# Patient Record
Sex: Female | Born: 1965 | Race: White | Hispanic: No | Marital: Married | Smoking: Former smoker
Health system: Southern US, Community
[De-identification: ages and names within clinical notes are randomized; demographics above are authoritative.]

## PROBLEM LIST (undated history)

## (undated) DIAGNOSIS — Z87442 Personal history of urinary calculi: Secondary | ICD-10-CM

## (undated) DIAGNOSIS — E7800 Pure hypercholesterolemia, unspecified: Secondary | ICD-10-CM

## (undated) DIAGNOSIS — R519 Headache, unspecified: Secondary | ICD-10-CM

## (undated) DIAGNOSIS — Z8489 Family history of other specified conditions: Secondary | ICD-10-CM

## (undated) DIAGNOSIS — R112 Nausea with vomiting, unspecified: Secondary | ICD-10-CM

## (undated) DIAGNOSIS — Z9889 Other specified postprocedural states: Secondary | ICD-10-CM

## (undated) DIAGNOSIS — K219 Gastro-esophageal reflux disease without esophagitis: Secondary | ICD-10-CM

## (undated) HISTORY — PX: ECTOPIC PREGNANCY SURGERY: SHX613

## (undated) HISTORY — PX: COLONOSCOPY: SHX174

## (undated) HISTORY — PX: ESOPHAGEAL DILATION: SHX303

## (undated) HISTORY — PX: RHINOPLASTY: SUR1284

## (undated) HISTORY — DX: Gastro-esophageal reflux disease without esophagitis: K21.9

---

## 1997-08-10 ENCOUNTER — Inpatient Hospital Stay (HOSPITAL_COMMUNITY): Admission: AD | Admit: 1997-08-10 | Discharge: 1997-08-10 | Payer: Self-pay | Admitting: Obstetrics and Gynecology

## 1997-08-11 ENCOUNTER — Inpatient Hospital Stay (HOSPITAL_COMMUNITY): Admission: AD | Admit: 1997-08-11 | Discharge: 1997-08-14 | Payer: Self-pay | Admitting: Obstetrics and Gynecology

## 1997-09-22 ENCOUNTER — Other Ambulatory Visit: Admission: RE | Admit: 1997-09-22 | Discharge: 1997-09-22 | Payer: Self-pay | Admitting: Obstetrics and Gynecology

## 1998-10-24 ENCOUNTER — Other Ambulatory Visit: Admission: RE | Admit: 1998-10-24 | Discharge: 1998-10-24 | Payer: Self-pay | Admitting: Obstetrics and Gynecology

## 1999-05-12 ENCOUNTER — Inpatient Hospital Stay (HOSPITAL_COMMUNITY): Admission: AD | Admit: 1999-05-12 | Discharge: 1999-05-14 | Payer: Self-pay | Admitting: *Deleted

## 1999-06-13 ENCOUNTER — Other Ambulatory Visit: Admission: RE | Admit: 1999-06-13 | Discharge: 1999-06-13 | Payer: Self-pay | Admitting: Obstetrics and Gynecology

## 2003-03-02 ENCOUNTER — Other Ambulatory Visit: Admission: RE | Admit: 2003-03-02 | Discharge: 2003-03-02 | Payer: Self-pay | Admitting: Obstetrics and Gynecology

## 2004-02-09 ENCOUNTER — Encounter: Admission: RE | Admit: 2004-02-09 | Discharge: 2004-02-09 | Payer: Self-pay | Admitting: Otolaryngology

## 2004-03-15 ENCOUNTER — Other Ambulatory Visit: Admission: RE | Admit: 2004-03-15 | Discharge: 2004-03-15 | Payer: Self-pay | Admitting: Obstetrics and Gynecology

## 2004-03-29 ENCOUNTER — Ambulatory Visit (HOSPITAL_COMMUNITY): Admission: RE | Admit: 2004-03-29 | Discharge: 2004-03-29 | Payer: Self-pay | Admitting: Obstetrics and Gynecology

## 2005-12-23 ENCOUNTER — Other Ambulatory Visit: Admission: RE | Admit: 2005-12-23 | Discharge: 2005-12-23 | Payer: Self-pay | Admitting: Obstetrics and Gynecology

## 2006-02-24 ENCOUNTER — Ambulatory Visit (HOSPITAL_COMMUNITY): Admission: RE | Admit: 2006-02-24 | Discharge: 2006-02-24 | Payer: Self-pay | Admitting: Obstetrics and Gynecology

## 2006-12-16 IMAGING — CT CT NECK W/ CM
2 series · 10 of 14 positions shown, 12 images · IV contrast (75CC OMNI 300)
Comparison: None.

CLINICAL DATA: Sore throat for 10-14 days despite antibiotic therapy.  Question peritonsillar abscess.
CT OF THE NECK WITH CONTRAST:
Multidetector helical CT imaging was performed during the IV bolus injection of 75 cc of Omnipaque 300.

[Series 2: neck · axial · 0.39mm/px · z∈[+11,+195]mm · 8 of 64 slices shown, 10 images]
[im 8/64  soft-tissue]
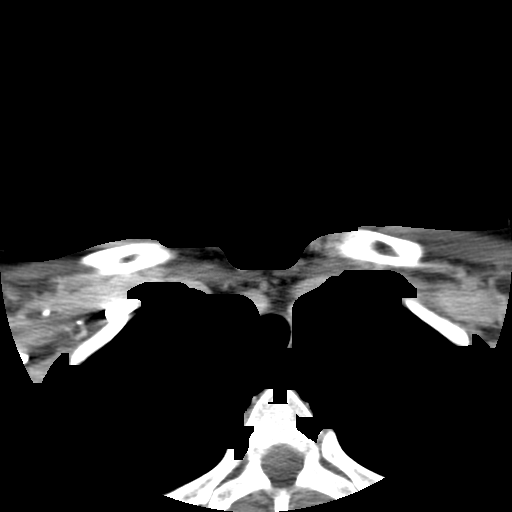
[im 8/64  bone]
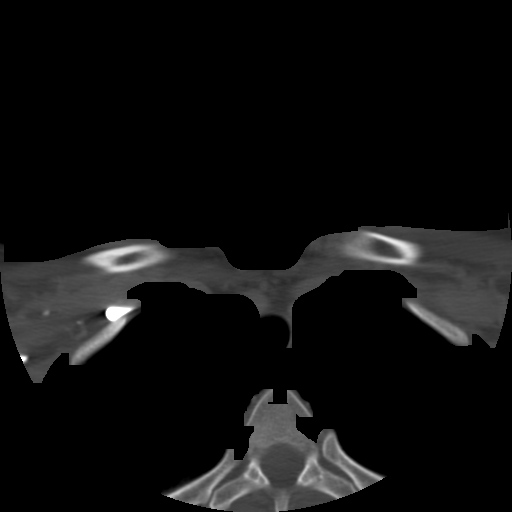
[im 15/64  bone]
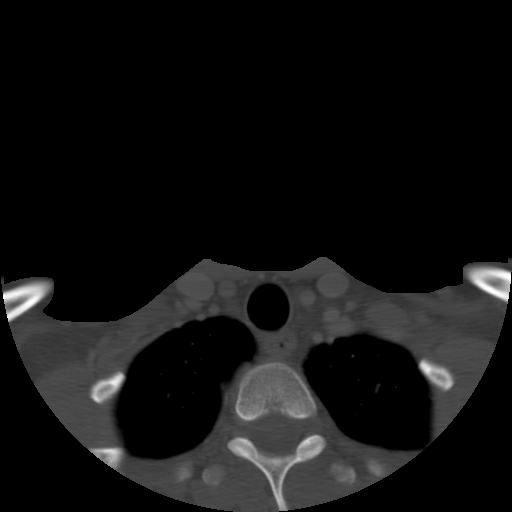
[im 22/64  bone]
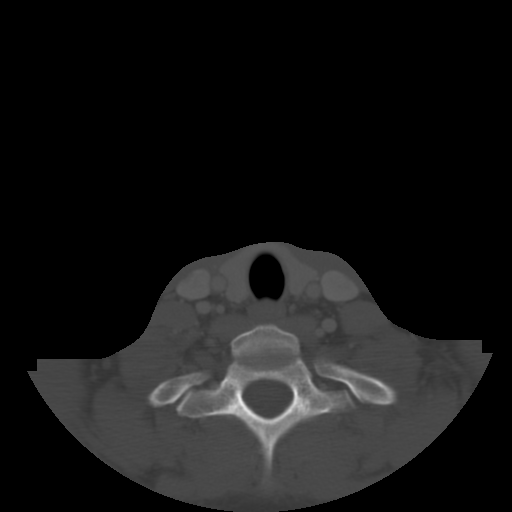
[im 29/64  bone]
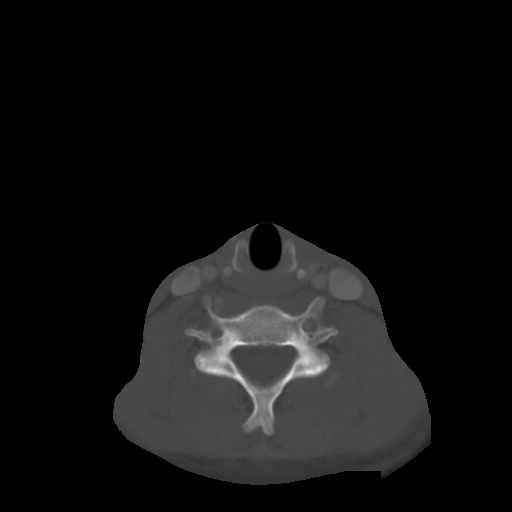
[im 36/64  soft-tissue]
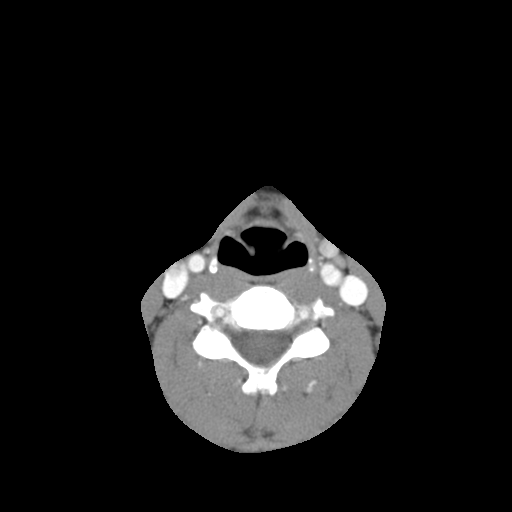
[im 36/64  bone]
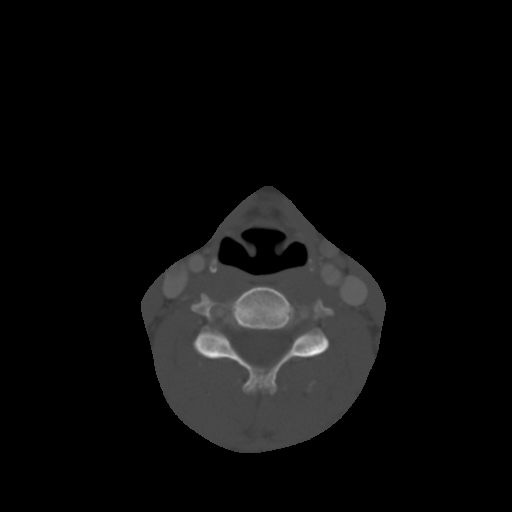
[im 43/64  bone]
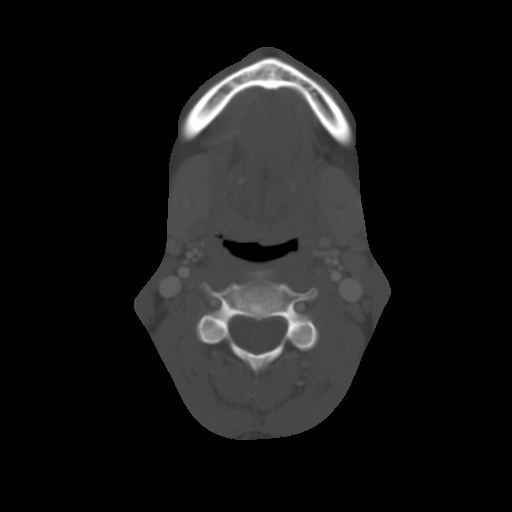
[im 50/64  bone]
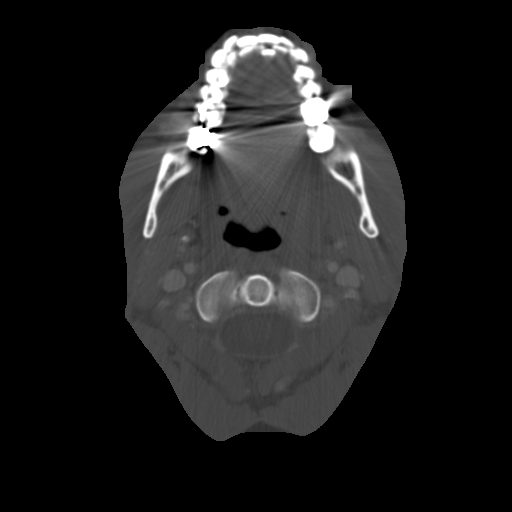
[im 57/64  bone]
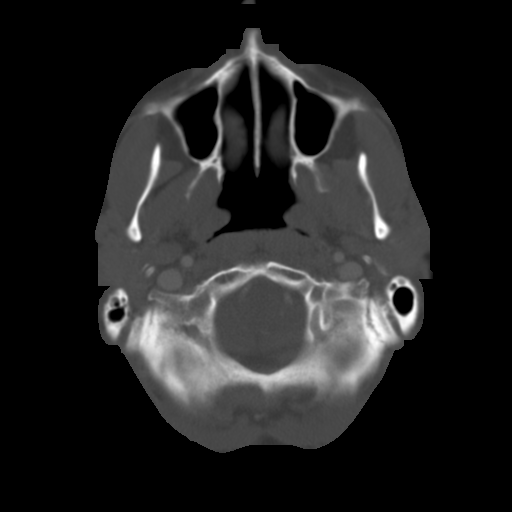

[Series 3: recon 2: neck · axial · 0.59mm/px · z∈[+15,+41]mm · 2 of 23 slices shown]
[im 8/23  bone]
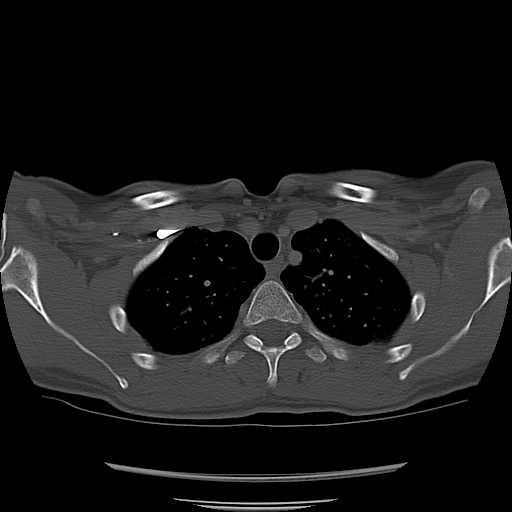
[im 15/23  bone]
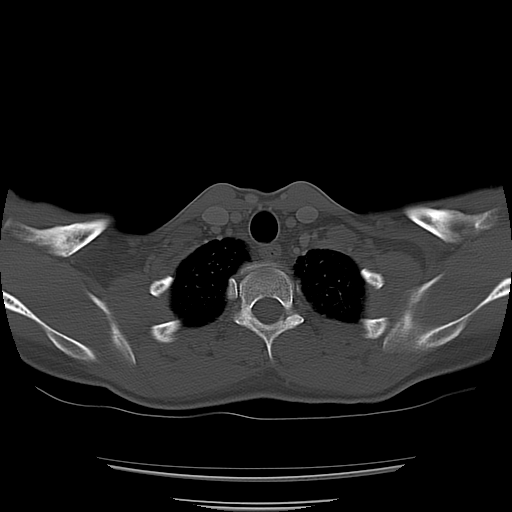

[10 of 14 positions shown; findings below may reference images not displayed]

FINDINGS: There is asymmetric enlargement of the left palatine tonsil compared with the right.  Central low attenuation is noted within the tonsil, measuring up to 6 mm in diameter on image 17.  Lesser, well defined low attenuation is seen more superiorly on images 14 and 16.  There is some beam hardening artifact related to the patient's dental Youvens.  The fat planes in the parapharyngeal space are preserved, and no retropharyngeal or prevertebral abnormalities are demonstrated.  Mild asymmetry of the nasopharynx with fullness on the left may be secondary to secretions.  Right tonsil appears unremarkable.  The visualized paranasal sinuses and intracranial structures are unremarkable.  No enlarged cervical lymph nodes are seen.
IMPRESSION: 1.  There is asymmetric enlargement of the left palatine tonsil with central low attenuation compatible with a small tonsillar abscess.  No significant peritonsillar inflammatory changes are identified.
2.  A preliminary report of these findings has been sent with the patient to Dr. Enrichetta?[REDACTED].

## 2007-08-12 ENCOUNTER — Emergency Department (HOSPITAL_BASED_OUTPATIENT_CLINIC_OR_DEPARTMENT_OTHER): Admission: EM | Admit: 2007-08-12 | Discharge: 2007-08-12 | Payer: Self-pay | Admitting: Emergency Medicine

## 2007-08-13 ENCOUNTER — Telehealth (INDEPENDENT_AMBULATORY_CARE_PROVIDER_SITE_OTHER): Payer: Self-pay

## 2007-08-14 ENCOUNTER — Emergency Department (HOSPITAL_BASED_OUTPATIENT_CLINIC_OR_DEPARTMENT_OTHER): Admission: EM | Admit: 2007-08-14 | Discharge: 2007-08-14 | Payer: Self-pay | Admitting: Emergency Medicine

## 2007-08-17 ENCOUNTER — Ambulatory Visit: Payer: Self-pay | Admitting: Internal Medicine

## 2007-08-17 DIAGNOSIS — R079 Chest pain, unspecified: Secondary | ICD-10-CM | POA: Insufficient documentation

## 2007-08-17 DIAGNOSIS — R1319 Other dysphagia: Secondary | ICD-10-CM

## 2007-08-20 ENCOUNTER — Ambulatory Visit (HOSPITAL_COMMUNITY): Admission: RE | Admit: 2007-08-20 | Discharge: 2007-08-20 | Payer: Self-pay | Admitting: Internal Medicine

## 2007-08-21 ENCOUNTER — Telehealth: Payer: Self-pay | Admitting: Internal Medicine

## 2007-08-25 ENCOUNTER — Encounter: Payer: Self-pay | Admitting: Internal Medicine

## 2007-08-25 ENCOUNTER — Ambulatory Visit: Payer: Self-pay | Admitting: Internal Medicine

## 2007-08-28 ENCOUNTER — Ambulatory Visit (HOSPITAL_COMMUNITY): Admission: RE | Admit: 2007-08-28 | Discharge: 2007-08-28 | Payer: Self-pay | Admitting: Internal Medicine

## 2007-09-21 ENCOUNTER — Ambulatory Visit: Payer: Self-pay | Admitting: Endocrinology

## 2007-09-21 DIAGNOSIS — E041 Nontoxic single thyroid nodule: Secondary | ICD-10-CM | POA: Insufficient documentation

## 2007-09-21 LAB — CONVERTED CEMR LAB: TSH: 0.86 u[IU]/mL

## 2007-09-23 ENCOUNTER — Encounter: Payer: Self-pay | Admitting: Endocrinology

## 2007-09-23 ENCOUNTER — Encounter: Admission: RE | Admit: 2007-09-23 | Discharge: 2007-09-23 | Payer: Self-pay | Admitting: Endocrinology

## 2007-09-23 ENCOUNTER — Other Ambulatory Visit: Admission: RE | Admit: 2007-09-23 | Discharge: 2007-09-23 | Payer: Self-pay | Admitting: Interventional Radiology

## 2007-09-23 ENCOUNTER — Encounter (INDEPENDENT_AMBULATORY_CARE_PROVIDER_SITE_OTHER): Payer: Self-pay | Admitting: Interventional Radiology

## 2008-11-02 ENCOUNTER — Ambulatory Visit (HOSPITAL_COMMUNITY): Admission: RE | Admit: 2008-11-02 | Discharge: 2008-11-02 | Payer: Self-pay | Admitting: Plastic Surgery

## 2009-07-13 ENCOUNTER — Ambulatory Visit: Payer: Self-pay | Admitting: Radiology

## 2009-07-13 ENCOUNTER — Emergency Department (HOSPITAL_BASED_OUTPATIENT_CLINIC_OR_DEPARTMENT_OTHER): Admission: EM | Admit: 2009-07-13 | Discharge: 2009-07-13 | Payer: Self-pay | Admitting: Emergency Medicine

## 2010-03-04 ENCOUNTER — Encounter: Payer: Self-pay | Admitting: Obstetrics and Gynecology

## 2010-04-30 LAB — BASIC METABOLIC PANEL
BUN: 15 mg/dL (ref 6–23)
CO2: 24 mEq/L (ref 19–32)
Calcium: 9.6 mg/dL (ref 8.4–10.5)
Chloride: 110 mEq/L (ref 96–112)
Creatinine, Ser: 0.8 mg/dL (ref 0.4–1.2)
GFR calc Af Amer: >60 mL/min (ref >60)
GFR calc non Af Amer: >60 mL/min (ref >60)
Glucose, Bld: 93 mg/dL (ref 70–99)
Potassium: 4.1 mEq/L (ref 3.5–5.1)
Sodium: 142 mEq/L (ref 135–145)

## 2010-04-30 LAB — CBC
HCT: 43 % (ref 36.0–46.0)
Hemoglobin: 14.3 g/dL (ref 12.0–15.0)
MCHC: 33.3 g/dL (ref 30.0–36.0)
MCV: 93.6 fL (ref 78.0–100.0)
Platelets: 203 10*3/uL (ref 150–400)
RBC: 4.59 MIL/uL (ref 3.87–5.11)
RDW: 12.1 % (ref 11.5–15.5)
WBC: 4.7 10*3/uL (ref 4.0–10.5)

## 2010-04-30 LAB — POCT CARDIAC MARKERS
CKMB, poc: <1 ng/mL — ABNORMAL LOW (ref 1.0–8.0)
Myoglobin, poc: 43.9 ng/mL (ref 12–200)
Troponin i, poc: <0.05 ng/mL (ref 0.00–0.09)

## 2010-11-08 LAB — BASIC METABOLIC PANEL
BUN: 21
CO2: 27
Calcium: 9.3
Chloride: 104
Creatinine, Ser: 0.9
GFR calc Af Amer: >60
GFR calc non Af Amer: >60
Glucose, Bld: 121 — ABNORMAL HIGH
Potassium: 3.7
Sodium: 139

## 2010-11-08 LAB — CBC
HCT: 42.4
Hemoglobin: 14.4
MCHC: 33.9
MCV: 93.7
Platelets: 188
RBC: 4.53
RDW: 12.4
WBC: 8.2

## 2010-11-08 LAB — DIFFERENTIAL
Basophils Absolute: 0
Basophils Relative: 1
Eosinophils Absolute: 0.1
Eosinophils Relative: 1
Lymphocytes Relative: 17
Lymphs Abs: 1.4
Monocytes Absolute: 0.5
Monocytes Relative: 6
Neutro Abs: 6.2
Neutrophils Relative %: 75

## 2010-11-08 LAB — POCT CARDIAC MARKERS
CKMB, poc: <1 — ABNORMAL LOW
Myoglobin, poc: 28.1
Troponin i, poc: <0.05

## 2013-08-04 ENCOUNTER — Encounter: Payer: Self-pay | Admitting: *Deleted

## 2013-11-24 ENCOUNTER — Encounter: Payer: Self-pay | Admitting: Internal Medicine

## 2014-04-11 ENCOUNTER — Other Ambulatory Visit: Payer: Self-pay | Admitting: Obstetrics and Gynecology

## 2014-04-12 LAB — CYTOLOGY - PAP

## 2018-10-12 ENCOUNTER — Other Ambulatory Visit: Payer: Self-pay | Admitting: Plastic Surgery

## 2018-11-13 ENCOUNTER — Other Ambulatory Visit: Payer: Self-pay | Admitting: Plastic Surgery

## 2021-05-08 ENCOUNTER — Emergency Department (HOSPITAL_COMMUNITY): Payer: 59

## 2021-05-08 ENCOUNTER — Other Ambulatory Visit: Payer: Self-pay

## 2021-05-08 ENCOUNTER — Emergency Department (HOSPITAL_COMMUNITY): Payer: 59 | Admitting: Certified Registered"

## 2021-05-08 ENCOUNTER — Ambulatory Visit (HOSPITAL_COMMUNITY)
Admission: EM | Admit: 2021-05-08 | Discharge: 2021-05-08 | Disposition: A | Discharge status: Home / Self Care | Payer: 59 | Attending: Gastroenterology | Admitting: Gastroenterology

## 2021-05-08 ENCOUNTER — Emergency Department (EMERGENCY_DEPARTMENT_HOSPITAL): Payer: 59 | Admitting: Certified Registered"

## 2021-05-08 ENCOUNTER — Encounter (HOSPITAL_COMMUNITY): Payer: Self-pay | Admitting: Emergency Medicine

## 2021-05-08 ENCOUNTER — Telehealth: Payer: Self-pay

## 2021-05-08 ENCOUNTER — Encounter (HOSPITAL_COMMUNITY): Admission: EM | Disposition: A | Discharge status: Home / Self Care | Payer: Self-pay | Source: Home / Self Care

## 2021-05-08 DIAGNOSIS — X58XXXA Exposure to other specified factors, initial encounter: Secondary | ICD-10-CM | POA: Diagnosis not present

## 2021-05-08 DIAGNOSIS — T18108A Unspecified foreign body in esophagus causing other injury, initial encounter: Secondary | ICD-10-CM

## 2021-05-08 DIAGNOSIS — R1319 Other dysphagia: Secondary | ICD-10-CM

## 2021-05-08 DIAGNOSIS — K2289 Other specified disease of esophagus: Secondary | ICD-10-CM | POA: Diagnosis not present

## 2021-05-08 DIAGNOSIS — R0789 Other chest pain: Secondary | ICD-10-CM | POA: Diagnosis not present

## 2021-05-08 DIAGNOSIS — R1310 Dysphagia, unspecified: Secondary | ICD-10-CM | POA: Diagnosis present

## 2021-05-08 DIAGNOSIS — D721 Eosinophilia, unspecified: Secondary | ICD-10-CM | POA: Insufficient documentation

## 2021-05-08 DIAGNOSIS — K449 Diaphragmatic hernia without obstruction or gangrene: Secondary | ICD-10-CM | POA: Insufficient documentation

## 2021-05-08 DIAGNOSIS — K2950 Unspecified chronic gastritis without bleeding: Secondary | ICD-10-CM | POA: Insufficient documentation

## 2021-05-08 DIAGNOSIS — K222 Esophageal obstruction: Secondary | ICD-10-CM

## 2021-05-08 DIAGNOSIS — R1314 Dysphagia, pharyngoesophageal phase: Secondary | ICD-10-CM

## 2021-05-08 DIAGNOSIS — K208 Other esophagitis without bleeding: Secondary | ICD-10-CM | POA: Diagnosis not present

## 2021-05-08 DIAGNOSIS — T18128A Food in esophagus causing other injury, initial encounter: Secondary | ICD-10-CM | POA: Insufficient documentation

## 2021-05-08 HISTORY — PX: FOREIGN BODY REMOVAL: SHX962

## 2021-05-08 HISTORY — PX: BIOPSY: SHX5522

## 2021-05-08 HISTORY — PX: ESOPHAGOGASTRODUODENOSCOPY: SHX5428

## 2021-05-08 HISTORY — DX: Pure hypercholesterolemia, unspecified: E78.00

## 2021-05-08 HISTORY — DX: Other specified postprocedural states: R11.2

## 2021-05-08 HISTORY — DX: Other specified postprocedural states: Z98.890

## 2021-05-08 LAB — CBC
HCT: 48.5 % — ABNORMAL HIGH (ref 36.0–46.0)
Hemoglobin: 16.1 g/dL — ABNORMAL HIGH (ref 12.0–15.0)
MCH: 31.5 pg (ref 26.0–34.0)
MCHC: 33.2 g/dL (ref 30.0–36.0)
MCV: 94.9 fL (ref 80.0–100.0)
Platelets: 187 10*3/uL (ref 150–400)
RBC: 5.11 MIL/uL (ref 3.87–5.11)
RDW: 12.8 % (ref 11.5–15.5)
WBC: 5.6 10*3/uL (ref 4.0–10.5)
nRBC: 0 % (ref 0.0–0.2)

## 2021-05-08 LAB — BASIC METABOLIC PANEL
Anion gap: 5 (ref 5–15)
BUN: 10 mg/dL (ref 6–20)
CO2: 25 mmol/L (ref 22–32)
Calcium: 10.8 mg/dL — ABNORMAL HIGH (ref 8.9–10.3)
Chloride: 108 mmol/L (ref 98–111)
Creatinine, Ser: 0.9 mg/dL (ref 0.44–1.00)
GFR, Estimated: >60 mL/min (ref >60)
Glucose, Bld: 103 mg/dL — ABNORMAL HIGH (ref 70–99)
Potassium: 4.3 mmol/L (ref 3.5–5.1)
Sodium: 138 mmol/L (ref 135–145)

## 2021-05-08 LAB — TROPONIN I (HIGH SENSITIVITY): Troponin I (High Sensitivity): <2 ng/L (ref <18)

## 2021-05-08 LAB — I-STAT BETA HCG BLOOD, ED (MC, WL, AP ONLY): I-stat hCG, quantitative: <5 m[IU]/mL (ref <5)

## 2021-05-08 SURGERY — EGD (ESOPHAGOGASTRODUODENOSCOPY)
Anesthesia: Monitor Anesthesia Care

## 2021-05-08 MED ORDER — OMEPRAZOLE 40 MG PO CPDR: 40.0000 mg | DELAYED_RELEASE_CAPSULE | Freq: Every day | ORAL | 12 refills | Status: AC

## 2021-05-08 MED ORDER — PROPOFOL 10 MG/ML IV BOLUS
INTRAVENOUS | Status: DC | PRN
  Administered 2021-05-08 (×3): 10 mg via INTRAVENOUS

## 2021-05-08 MED ORDER — PROPOFOL 500 MG/50ML IV EMUL
INTRAVENOUS | Status: DC | PRN
  Administered 2021-05-08: 100 ug/kg/min via INTRAVENOUS

## 2021-05-08 MED ORDER — NITROGLYCERIN 0.4 MG SL SUBL
0.4000 mg | SUBLINGUAL_TABLET | Freq: Once | SUBLINGUAL | Status: AC
  Administered 2021-05-08: 0.4 mg via SUBLINGUAL
  Filled 2021-05-08: qty 1

## 2021-05-08 MED ORDER — ONDANSETRON HCL 4 MG/2ML IJ SOLN
INTRAMUSCULAR | Status: DC | PRN
Start: 2021-05-08 — End: 2021-05-08
  Administered 2021-05-08: 4 mg via INTRAVENOUS

## 2021-05-08 MED ORDER — SUCRALFATE 1 GM/10ML PO SUSP: 1.0000 g | Freq: Two times a day (BID) | ORAL | 12 refills | Status: DC

## 2021-05-08 MED ORDER — SODIUM CHLORIDE 0.9 % IV SOLN: INTRAVENOUS | Status: DC

## 2021-05-08 MED ORDER — LACTATED RINGERS IV SOLN
INTRAVENOUS | Status: DC | PRN
Start: 2021-05-08 — End: 2021-05-08

## 2021-05-08 NOTE — Consult Note (Addendum)
? ?                                                                          [facility] Consult: ?9:49 AM ?05/08/2021 ? LOS: 0 days  ? ? ?Referring Provider: Dr   ?Primary Care Physician:  Loma Boston MD at [facility]   ?Primary Gastroenterologist:  Dr. Carlean Purl, remotely  ? ? ? ?Reason for Consultation: Atypical chest pain ?  ?HPI: Barbara Jimenez is a 56 y.o. female.  Hx tonsillar abscess 2005.  2009 aspiration of left thyroid cyst, cytology: Colloid cyst and/or nonneoplastic goiter.  HCV nonreactive on testing 02/20/2021. ?Heart murmur.  Unremarkable 2D echo 02/20/2021 at [facility] w trivial MR and TR.  Deg spine disease.  Elevated Ca++ level of 10.9 in Jan 2023, Albumin nml.  Basal cell skin Ca face 11/2018.  Cosmetic botox injections.  Hyperlipidemia.  Abdominoplasty and breast augmentation.  Cosmetic facial surgery.  ? ?Longstanding dysphagia.  Currently episodes occurring at least twice a week and have progressed over last several months and years.  Larger pieces of solid food, rarely meds and sometimes dry foods get stuck at level of lower esophagus.  They just sit there and cause localized pain which resolves within a few minutes.  If she drinks liquids to try to resolve the problem, it only makes the pain and symptoms worse.  She has been living with this and tolerating it for many years.  Was seen in early January by PCP Dr. Loma Boston at [facility] for this problem, he referred her to GI Dr. Dorris Singh where she has appointment on 05/22/2021. ?Last evening she took her usual meds and they got stuck.  Instead of causing a few minutes of symptoms, the symptoms have persisted overnight.  Drinking water to try to get it to resolve makes the problem worse.  She is able to tolerate her secretions.  She presented to the ED with these complaints.  No trouble breathing.  No sense of palpitations.  No regurgitation ? ?2009  Esophagram.  Small HH.  Moderate GER.  Barium tablet temporarily lodged at GE junction but passed without significant delay.  Extrinsic indentation in the lower cervical esophagus possibly indicative of enlarged left lobe thyroid. ? 08/2007 EGD.  Dr. Carlean Purl dilated a lower esophageal ring.  Fissuring appearance to esophageal mucosa, biopsies obtained to look for eosinophilic esophagitis.  Otherwise normal EGD.  Pathology showed benign squamous epithelium.  No evidence of infection, eosinophilic esophagitis or intestinal metaplasia/dysplasia/malignancy.   ?Colonoscopy ~ age 75 in [place]: "normal" per pt, on 10 y fup screening schedule. No epic records.    ? ?Recent trip to [place] 4th thru 18th March.  About 2 d remain of malarial prophy med ?atovaquone-proguaniL (MALARONE) 250-100 mg for 26 days.  ? ?So far EKG is unremarkable, no evidence for ischemia.  Neck xray unremarkable.  No labs ordered. ? ?Drinks a cocktail 1 to 2 x week.  No cigarettes or mja.   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  3 children, all adults ranging from 67-22.  2 grandchildren. ?Family history of bypass surgery in her father who has type 2 diabetes. ? ? ? ?Prior to Admission  medications   ?Not on File  ? ?Flexeril.  Magnesium.  Probiotic.  Progesterone.  Rosuvastatin.  Estrace.  Flexeril.  Pyridium.  Motrin.  Unspecified herbal supplements. ? ?Scheduled Meds: ? ?Infusions: ? ?PRN Meds: ? ? ? ?Allergies as of 05/08/2021 - Review Complete 05/08/2021  ?Allergen Reaction Noted  ? Codeine Itching 05/08/2021  ? ? ?Family History  ?Family history unknown: Yes  ? ? ?Social History  ? ?Socioeconomic History  ? Marital status: Married  ?  Spouse name: Not on file  ? Number of children: Not on file  ? Years of education: Not on file  ? Highest education level: Not on file  ?Occupational History  ? Not on file  ?Tobacco Use  ? Smoking status: Never  ? Smokeless tobacco: Never  ?Substance and Sexual  Activity  ? Alcohol use: Yes  ? Drug use: Not Currently  ? Sexual activity: Not on file  ?Other Topics Concern  ? Not on file  ?Social History Narrative  ? Not on file  ? ?Social Determinants of Health  ? ?Financial Resource Strain: Not on file  ?Food Insecurity: Not on file  ?Transportation Needs: Not on file  ?Physical Activity: Not on file  ?Stress: Not on file  ?Social Connections: Not on file  ?Intimate Partner Violence: Not on file  ? ? ?REVIEW OF SYSTEMS: ?Constitutional: No weakness. ?ENT:  No nose bleeds ?Pulm: No shortness of breath or cough ?CV:  No palpitations, no LE edema.  No angina ?GU:  No hematuria, no frequency ?GI: See HPI. ?Heme: No unusual bleeding or bruising. ?Transfusions: None ?Neuro:  No headaches, no peripheral tingling or numbness ?Derm:  No itching, no rash or sores.  ?Endocrine:  No sweats or chills.  No polyuria or dysuria ?Immunization:  no epic based documentation of vaccines, notes mention. Had negative Covid 19 test within last 2 weeks.   ?Travel:  None beyond local counties in last few months.  ? ? ?PHYSICAL EXAM: ?Vital signs in last 24 hours: ?Vitals:  ? 05/08/21 0908  ?BP: 114/80  ?Pulse: 81  ?Resp: 16  ?Temp: 98.4 ?F (36.9 ?C)  ?SpO2: 100%  ? ?Wt Readings from Last 3 Encounters:  ?No data found for Wt  ? ? ?General: Well-appearing, alert, comfortable ?Head: No facial asymmetry or swelling.  No signs of trauma. ?Eyes: No scleral icterus or conjunctival pallor.  EOMI. ?Ears: Not hard of hearing ?Nose: No congestion or discharge ?Mouth: Good dentition.  Tongue midline.  Mucosa moist, pink, clear. ?Neck: No JVD, no crepitus ?Lungs: Clear bilaterally.  No labored breathing or cough. ?Heart: RRR.  Soft systolic murmer.  S1, S2 present ?Abdomen: Soft without distention.  No organomegaly, bruits, hernias, masses.Marland Kitchen   ?Rectal: Deferred ?Musc/Skeltl: No joint redness, swelling or gross deformity. ?Extremities: No CCE. ?Neurologic: Oriented x3.  Moves all 4 limbs without tremor or  weakness ?Skin:  sunburned looking facial skin.   ?Nodes: No cervical adenopathy. ?Psych: Calm, cooperative, well spoken. ? ?Intake/Output from previous day: ?No intake/output data recorded. ?Intake/Output this shift: ?No intake/output data recorded. ? ?LAB RESULTS: ?No results for input(s): WBC, HGB, HCT, PLT in the last 72 hours. ?BMET ?Lab Results  ?Component Value Date  ? NA 142 07/13/2009  ? NA 139 08/12/2007  ? K 4.1 07/13/2009  ? K 3.7 08/12/2007  ? CL 110 07/13/2009  ? CL 104 08/12/2007  ? CO2 24 07/13/2009  ? CO2 27 08/12/2007  ? GLUCOSE 93 07/13/2009  ? GLUCOSE 121 (H) 08/12/2007  ?  BUN 15 07/13/2009  ? BUN 21 08/12/2007  ? CREATININE 0.8 07/13/2009  ? CREATININE 0.9 08/12/2007  ? CALCIUM 9.6 07/13/2009  ? CALCIUM 9.3 08/12/2007  ? ? ?RADIOLOGY STUDIES: ?No results found. ? ? ? ?IMPRESSION:  ? ?  Acute pill dysphagia on top of longstanding chronic solid food/pill dysphagia.  History dilation of esophageal ring 2009. ? ?   Recent travel to [place], [place].  Has a couple of days remaining on prescription for Malarone malarial preventative.   ? ?  Mild hypercalcemia on labs early 02/2021.   ? ? ?PLAN:  ?  ? ?  EGD today.   ? ? ?Azucena Freed  05/08/2021, 9:49 AM ?Phone [phone] ? ? ?   ?

## 2021-05-08 NOTE — ED Triage Notes (Signed)
Pt. Stated, I took a handful of pills around 800pm last night and there is one that never went down. I figured it would dissolve but has not. ?

## 2021-05-08 NOTE — Transfer of Care (Signed)
Immediate Anesthesia Transfer of Care Note ? ?Patient: Barbara Jimenez ? ?Procedure(s) Performed: ESOPHAGOGASTRODUODENOSCOPY (EGD) ?BIOPSY ? ?Patient Location: Endoscopy Unit ? ?Anesthesia Type:MAC ? ?Level of Consciousness: awake, alert  and oriented ? ?Airway & Oxygen Therapy: Patient Spontanous Breathing ? ?Post-op Assessment: Report given to RN, Post -op Vital signs reviewed and stable and Patient moving all extremities X 4 ? ?Post vital signs: Reviewed and stable ? ?Last Vitals:  ?Vitals Value Taken Time  ?BP 127/70 05/08/21 1220  ?Temp 36.3 ?C 05/08/21 1220  ?Pulse 70 05/08/21 1223  ?Resp 16 05/08/21 1223  ?SpO2 100 % 05/08/21 1223  ?Vitals shown include unvalidated device data. ? ?Last Pain:  ?Vitals:  ? 05/08/21 1220  ?TempSrc: Temporal  ?PainSc: 0-No pain  ?   ? ?  ? ?Complications: No notable events documented. ?

## 2021-05-08 NOTE — Op Note (Addendum)
[facility] ?Patient Name: Barbara Jimenez ?Procedure Date : 05/08/2021 ?MRN: [id number] ?Attending MD: Justice Britain , MD ?Date of Birth: 1966/02/11 ?CSN: [id number] ?Age: 56 ?Admit Type: Emergency Department ?Procedure:                Upper GI endoscopy ?Indications:              Esophageal dysphagia, Dysphagia, Odynophagia, Chest  ?                          pain (non cardiac) ?Providers:                Justice Britain, MD, Doristine Johns, RN, Narda Rutherford  ?                          [place], Technician, Garrison Columbus, CRNA ?Referring MD:             Estil Daft ?Medicines:                Monitored Anesthesia Care ?Complications:            No immediate complications. ?Estimated Blood Loss:     Estimated blood loss was minimal. ?Procedure:                Pre-Anesthesia Assessment: ?                          - Prior to the procedure, a History and Physical  ?                          was performed, and patient medications and  ?                          allergies were reviewed. The patient's tolerance of  ?                          previous anesthesia was also reviewed. The risks  ?                          and benefits of the procedure and the sedation  ?                          options and risks were discussed with the patient.  ?                          All questions were answered, and informed consent  ?                          was obtained. Prior Anticoagulants: The patient has  ?                          taken no previous anticoagulant or antiplatelet  ?                          agents. ASA Grade Assessment: II - A patient with  ?  mild systemic disease. After reviewing the risks  ?                          and benefits, the patient was deemed in  ?                          satisfactory condition to undergo the procedure. ?                          After obtaining informed consent, the endoscope was  ?                          passed under direct vision. Throughout  the  ?                          procedure, the patient's blood pressure, pulse, and  ?                          oxygen saturations were monitored continuously. The  ?                          GIF-[id number] ([id number]) Olympus endoscope was introduced  ?                          through the mouth, and advanced to the second part  ?                          of duodenum. The upper GI endoscopy was  ?                          accomplished without difficulty. The patient  ?                          tolerated the procedure. ?Scope In: ?Scope Out: ?Findings: ?     No gross lesions were noted in the proximal esophagus and in the mid  ?     esophagus. Biopsies were taken with a cold forceps for histology to rule  ?     out EoE/LoE. ?     Foodstuff and possible retained pill was found in the distal esophagus.  ?     Removal was accomplished by gently pushing the lesion into the stomach  ?     while monitoring for signs of clinical inability. This was able to be  ?     removed after 3 pushes into the stomach. ?     After the removal of this noted foodstuffs it revealed LA Grade D (one  ?     or more mucosal breaks involving at least 75% of esophageal  ?     circumference) esophagitis with no bleeding was found in the distal  ?     esophagus and revealed after removal of the foreign body. Biopsies were  ?     taken with a cold forceps for histology. ?     A presumed low-grade of narrowing Schatzki ring was found at the  ?     gastroesophageal junction with the esophagititis noted seems to be  ?  present though this could also just be peptic stricturing. ?     The Z-line was irregular and was found 39 cm from the incisors. ?     A 4 cm hiatal hernia was present. ?     Patchy mildly erythematous mucosa without bleeding was found in the  ?     entire examined stomach. Biopsies were taken with a cold forceps for  ?     histology and Helicobacter pylori testing. ?     No gross lesions were noted in the duodenal bulb, in the first  portion  ?     of the duodenum and in the second portion of the duodenum. ?Impression:               - No gross lesions in esophagus proximally.  ?                          Biopsied. ?                          - Food/pillstuff was found in the esophagus  ?                          distally. Removal was successful. LA Grade D  ?                          erosive esophagitis with no bleeding was revealed  ?                          distally. Biopsied. ?                          - Low-grade of narrowing Schatzki ring vs peptic  ?                          stricturing noted just below the foodstuffs. ?                          - Z-line irregular, 39 cm from the incisors. ?                          - 4 cm hiatal hernia. ?                          - Erythematous mucosa in the stomach. Biopsied. ?                          - No gross lesions in the duodenal bulb, in the  ?                          first portion of the duodenum and in the second  ?                          portion of the duodenum. ?Recommendation:           - The patient will be observed post-procedure,  ?  until all discharge criteria are met. ?                          - Discharge patient to home. ?                          - Patient has a contact number available for  ?                          emergencies. The signs and symptoms of potential  ?                          delayed complications were discussed with the  ?                          patient. Return to normal activities tomorrow.  ?                          Written discharge instructions were provided to the  ?                          patient. ?                          - Full liquid diet today. Then able to transition  ?                          to soft/pureed diet for now. ?                          - Start Omeprazole 40 mg twice daily. ?                          - Start Carafate twice daily for 1 month. ?                          - Repeat EGD in 8 weeks to begin dilation  if able  ?                          though we know it will take longer to get this  ?                          taken care of overall. ?                          - Await pathology results. ?                          - Return to GI clinic in 4 weeks. ?                          - The findings and recommendations were discussed  ?                          with the patient. ?                          -  The findings and recommendations were discussed  ?                          with the patient's family. ?Procedure Code(s):        --- Professional --- ?                          [phone], Esophagogastroduodenoscopy, flexible,  ?                          transoral; with removal of foreign body(s) ?                          54656, Esophagogastroduodenoscopy, flexible,  ?                          transoral; with biopsy, single or multiple ?Diagnosis Code(s):        --- Professional --- ?                          C12.751Z, Food in esophagus causing other injury,  ?                          initial encounter ?                          K20.80, Other esophagitis without bleeding ?                          K22.2, Esophageal obstruction ?                          K22.8, Other specified diseases of esophagus ?                          K44.9, Diaphragmatic hernia without obstruction or  ?                          gangrene ?                          K31.89, Other diseases of stomach and duodenum ?                          R13.14, Dysphagia, pharyngoesophageal phase ?                          R07.89, Other chest pain ?CPT copyright 2019 American Medical Association. All rights reserved. ?The codes documented in this report are preliminary and upon coder review may  ?be revised to meet current compliance requirements. ?Justice Britain, MD ?05/08/2021 12:25:59 PM ?Number of Addenda: 0 ?

## 2021-05-08 NOTE — Anesthesia Procedure Notes (Signed)
Procedure Name: [place] ?Date/Time: 05/08/2021 11:56 AM ?Performed by: Harden Mo, CRNA ?Pre-anesthesia Checklist: Patient identified, Emergency Drugs available, Suction available and Patient being monitored ?Patient Re-evaluated:Patient Re-evaluated prior to induction ?Oxygen Delivery Method: Nasal cannula ?Preoxygenation: Pre-oxygenation with 100% oxygen ?Induction Type: IV induction ?Placement Confirmation: positive ETCO2 and breath sounds checked- equal and bilateral ?Dental Injury: Teeth and Oropharynx as per pre-operative assessment  ? ? ? ? ?

## 2021-05-08 NOTE — ED Triage Notes (Signed)
Pt. States, my chest feels like its just so sore and it feels like its sore all around my chest.  ?

## 2021-05-08 NOTE — H&P (Signed)
? ?  GASTROENTEROLOGY PROCEDURE H&P NOTE  ? ?Primary Care Physician: ?Renato Shin, MD ? ?HPI: ?Barbara Jimenez is a 56 y.o. female who presents for EGD for evaluation of atypical chest pain, recurrent dysphagia/pill dysphagia, foreign body sensation. ? ?History reviewed. No pertinent past medical history. ?History reviewed. No pertinent surgical history. ?No current facility-administered medications for this encounter.  ? ?No current outpatient medications on file.  ? ?No current facility-administered medications for this encounter. ?No current outpatient medications on file. ?Allergies  ?Allergen Reactions  ? Codeine Itching  ? ?Family History  ?Family history unknown: Yes  ? ?Social History  ? ?Socioeconomic History  ? Marital status: Married  ?  Spouse name: Not on file  ? Number of children: Not on file  ? Years of education: Not on file  ? Highest education level: Not on file  ?Occupational History  ? Not on file  ?Tobacco Use  ? Smoking status: Never  ? Smokeless tobacco: Never  ?Substance and Sexual Activity  ? Alcohol use: Yes  ? Drug use: Not Currently  ? Sexual activity: Not on file  ?Other Topics Concern  ? Not on file  ?Social History Narrative  ? Not on file  ? ?Social Determinants of Health  ? ?Financial Resource Strain: Not on file  ?Food Insecurity: Not on file  ?Transportation Needs: Not on file  ?Physical Activity: Not on file  ?Stress: Not on file  ?Social Connections: Not on file  ?Intimate Partner Violence: Not on file  ? ? ?Physical Exam: ?Today's Vitals  ? 05/08/21 0908 05/08/21 0913  ?BP: 114/80   ?Pulse: 81   ?Resp: 16   ?Temp: 98.4 ?F (36.9 ?C)   ?TempSrc: Oral   ?SpO2: 100%   ?PainSc:  4   ? ?There is no height or weight on file to calculate BMI. ?GEN: NAD ?EYE: Sclerae anicteric ?ENT: MMM ?CV: Non-tachycardic ?GI: Soft, NT/ND ?NEURO:  Alert & Oriented x 3 ? ?Lab Results: ?Recent Labs  ?  05/08/21 ?1019  ?WBC 5.6  ?HGB 16.1*  ?HCT 48.5*  ?PLT 187  ? ?BMET ?Recent Labs  ?  05/08/21 ?1019   ?NA 138  ?K 4.3  ?CL 108  ?CO2 25  ?GLUCOSE 103*  ?BUN 10  ?CREATININE 0.90  ?CALCIUM 10.8*  ? ?LFT ?No results for input(s): PROT, ALBUMIN, AST, ALT, ALKPHOS, BILITOT, BILIDIR, IBILI in the last 72 hours. ?PT/INR ?No results for input(s): LABPROT, INR in the last 72 hours. ? ? ?Impression / Plan: ?This is a 56 y.o.female who presents for EGD for evaluation of atypical chest pain, recurrent dysphagia/pill dysphagia, foreign body sensation. ? ?The risks and benefits of endoscopic evaluation/treatment were discussed with the patient and/or family; these include but are not limited to the risk of perforation, infection, bleeding, missed lesions, lack of diagnosis, severe illness requiring hospitalization, as well as anesthesia and sedation related illnesses.  The patient's history has been reviewed, patient examined, no change in status, and deemed stable for procedure.  The patient and/or family is agreeable to proceed.  ? ? ?Justice Britain, MD ?[facility] ?Advanced Endoscopy ?Office # [phone] ? ?

## 2021-05-08 NOTE — Telephone Encounter (Signed)
-----   Message from Irving Copas., MD sent at 05/08/2021 12:37 PM EDT ----- ?Sarahjane Matherly, ?This patient needs a follow-up with me in 3 to 4 weeks.  Okay to use an overbook slot/held slot for me in clinic. ?Patient needs repeat EGD in 8 weeks in the [facility] for dilation. ?Thanks. ?GM ?

## 2021-05-08 NOTE — ED Provider Triage Note (Signed)
Emergency Medicine Provider Triage Evaluation Note ? ?Barbara Jimenez , a 56 y.o. female  was evaluated in triage.  Pt complains of foreign body sensation in throat.  She endorses a history of esophagus problems, reports that she has had her esophagus dilated before, has an appointment with [facility] in 2 weeks to work on follow-up.  She took a handful of pills last night that she normally takes, and reports she feels like something may be stuck.  The largest pill she reports is a magnesium tablet, she does not take any iron or other radiopaque pills.  She has not tried any medication in the past to help with his foreign body sensation.  She reports it happens very infrequently.  She is tolerating her own saliva, she is not having any difficulty breathing.  She reports when she does try to swallow down though she gets chest tightness, and the sensation that there is still something stuck there. ? ?Review of Systems  ?Positive: Foreign body, chest tightness ?Negative: Shob, chest pain at rest ? ?Physical Exam  ?BP 114/80 (BP Location: Right Arm)   Pulse 81   Temp 98.4 ?F (36.9 ?C) (Oral)   Resp 16   SpO2 100%  ?Gen:   Awake, no distress   ?Resp:  Normal effort  ?MSK:   Moves extremities without difficulty  ?Other:  No stridor, no foreign body noted in oropharynx ? ?Medical Decision Making  ?Medically screening exam initiated at 9:24 AM.  Appropriate orders placed.  [name] was informed that the remainder of the evaluation will be completed by another provider, this initial triage assessment does not replace that evaluation, and the importance of remaining in the ED until their evaluation is complete. ? ?Foreign body without stridor versus pill esophagitis.  We will trial sublingual nitroglycerin dissolved in 10 mL water ?  ?Anselmo Pickler, PA-C ?05/08/21 7494 ? ?

## 2021-05-08 NOTE — Discharge Instructions (Signed)
YOU HAD AN ENDOSCOPIC PROCEDURE TODAY: Refer to the procedure report and other information in the discharge instructions given to you for any specific questions about what was found during the examination. If this information does not answer your questions, please call [place] office at [phone] to clarify.  ° °YOU SHOULD EXPECT: Some feelings of bloating in the abdomen. Passage of more gas than usual. Walking can help get rid of the air that was put into your GI tract during the procedure and reduce the bloating. If you had a lower endoscopy (such as a colonoscopy or flexible sigmoidoscopy) you may notice spotting of blood in your stool or on the toilet paper. Some abdominal soreness may be present for a day or two, also. ° °DIET: Your first meal following the procedure should be a light meal and then it is ok to progress to your normal diet. A half-sandwich or bowl of soup is an example of a good first meal. Heavy or fried foods are harder to digest and may make you feel nauseous or bloated. Drink plenty of fluids but you should avoid alcoholic beverages for 24 hours. If you had a esophageal dilation, please see attached instructions for diet.   ° °ACTIVITY: Your care partner should take you home directly after the procedure. You should plan to take it easy, moving slowly for the rest of the day. You can resume normal activity the day after the procedure however YOU SHOULD NOT DRIVE, use power tools, machinery or perform tasks that involve climbing or major physical exertion for 24 hours (because of the sedation medicines used during the test).  ° °SYMPTOMS TO REPORT IMMEDIATELY: °A gastroenterologist can be reached at any hour. Please call [phone]  for any of the following symptoms:  °Following lower endoscopy (colonoscopy, flexible sigmoidoscopy) °Excessive amounts of blood in the stool  °Significant tenderness, worsening of abdominal pains  °Swelling of the abdomen that is new, acute  °Fever of 100° or  higher  °Following upper endoscopy (EGD, EUS, ERCP, esophageal dilation) °Vomiting of blood or coffee ground material  °New, significant abdominal pain  °New, significant chest pain or pain under the shoulder blades  °Painful or persistently difficult swallowing  °New shortness of breath  °Black, tarry-looking or red, bloody stools ° °FOLLOW UP:  °If any biopsies were taken you will be contacted by phone or by letter within the next 1-3 weeks. Call [phone]  if you have not heard about the biopsies in 3 weeks.  °Please also call with any specific questions about appointments or follow up tests. ° °

## 2021-05-08 NOTE — Anesthesia Preprocedure Evaluation (Addendum)
Anesthesia Evaluation  ?Patient identified by MRN, date of birth, ID band ?Patient awake ? ? ? ?Reviewed: ?Allergy & Precautions, NPO status , Patient's Chart, lab work & pertinent test results ? ?History of Anesthesia Complications ?(+) PONV and history of anesthetic complications ? ?Airway ?Mallampati: I ? ?TM Distance: >3 FB ?Neck ROM: Full ? ? ? Dental ?no notable dental hx. ?(+) Dental Advisory Given, Teeth Intact ?  ?Pulmonary ?neg pulmonary ROS,  ?  ?Pulmonary exam normal ?breath sounds clear to auscultation ? ? ? ? ? ? Cardiovascular ?negative cardio ROS ?Normal cardiovascular exam ?Rhythm:Regular Rate:Normal ? ? ?  ?Neuro/Psych ?negative neurological ROS ?   ? GI/Hepatic ?negative GI ROS, Neg liver ROS,   ?Endo/Other  ?negative endocrine ROS ? Renal/GU ?negative Renal ROS  ? ?  ?Musculoskeletal ?negative musculoskeletal ROS ?(+)  ? Abdominal ?  ?Peds ? Hematology ?negative hematology ROS ?(+)   ?Anesthesia Other Findings ? ? Reproductive/Obstetrics ?negative OB ROS ? ?  ? ? ? ? ? ? ? ? ? ? ? ? ? ?  ?  ? ? ? ? ? ? ? ?Anesthesia Physical ?Anesthesia Plan ? ?ASA: 2 ? ?Anesthesia Plan: MAC  ? ?Post-op Pain Management: Minimal or no pain anticipated  ? ?Induction: Intravenous ? ?PONV Risk Score and Plan: 4 or greater and Ondansetron, Treatment may vary due to age or medical condition, Propofol infusion and TIVA ? ?Airway Management Planned: Simple Face Mask ? ?Additional Equipment:  ? ?Intra-op Plan:  ? ?Post-operative Plan:  ? ?Informed Consent: I have reviewed the patients History and Physical, chart, labs and discussed the procedure including the risks, benefits and alternatives for the proposed anesthesia with the patient or authorized representative who has indicated his/her understanding and acceptance.  ? ? ? ?Dental advisory given ? ?Plan Discussed with: CRNA ? ?Anesthesia Plan Comments:   ? ? ? ? ? ?Anesthesia Quick Evaluation ? ?

## 2021-05-09 ENCOUNTER — Encounter (HOSPITAL_COMMUNITY): Payer: Self-pay | Admitting: Gastroenterology

## 2021-05-09 LAB — SURGICAL PATHOLOGY

## 2021-05-09 NOTE — Telephone Encounter (Signed)
The pt has been scheduled for an office visit on 06/08/21 at 950 am with GM.  EGD scheduled for 07/10/21 at 9 am.  The pt has been advised and instructed.  I have also mailed the information to her home.  ?

## 2021-05-09 NOTE — Anesthesia Postprocedure Evaluation (Signed)
Anesthesia Post Note ? ?Patient: Barbara Jimenez ? ?Procedure(s) Performed: ESOPHAGOGASTRODUODENOSCOPY (EGD) ?BIOPSY ? ?  ? ?Patient location during evaluation: PACU ?Anesthesia Type: MAC ?Level of consciousness: awake and alert ?Pain management: pain level controlled ?Vital Signs Assessment: post-procedure vital signs reviewed and stable ?Respiratory status: spontaneous breathing ?Cardiovascular status: stable ?Anesthetic complications: no ? ? ?No notable events documented. ? ?Last Vitals:  ?Vitals:  ? 05/08/21 1230 05/08/21 1240  ?BP: (!) 122/58 129/69  ?Pulse: 65 61  ?Resp: 15 13  ?Temp:    ?SpO2: 100% 99%  ?  ?Last Pain:  ?Vitals:  ? 05/08/21 1240  ?TempSrc:   ?PainSc: 0-No pain  ? ? ?  ?  ?  ?  ?  ?  ? ?Nolon Nations ? ? ? ? ?

## 2021-05-10 ENCOUNTER — Encounter: Payer: Self-pay | Admitting: Gastroenterology

## 2021-06-08 ENCOUNTER — Encounter: Payer: Self-pay | Admitting: Gastroenterology

## 2021-06-08 ENCOUNTER — Ambulatory Visit: Payer: 59 | Admitting: Gastroenterology

## 2021-06-08 VITALS — BP 114/72 | HR 75 | Ht 67.0 in | Wt 172.1 lb

## 2021-06-08 DIAGNOSIS — K200 Eosinophilic esophagitis: Secondary | ICD-10-CM

## 2021-06-08 DIAGNOSIS — K209 Esophagitis, unspecified without bleeding: Secondary | ICD-10-CM | POA: Diagnosis not present

## 2021-06-08 DIAGNOSIS — K222 Esophageal obstruction: Secondary | ICD-10-CM | POA: Diagnosis not present

## 2021-06-08 DIAGNOSIS — R1319 Other dysphagia: Secondary | ICD-10-CM

## 2021-06-08 DIAGNOSIS — R933 Abnormal findings on diagnostic imaging of other parts of digestive tract: Secondary | ICD-10-CM

## 2021-06-08 NOTE — Progress Notes (Signed)
? ?GASTROENTEROLOGY OUTPATIENT CLINIC VISIT  ? ?Primary Care Provider ?Waters, Renee Pain, MD ?[facility] ?[place] [address] ?[phone] ? ? ?Patient Profile: ?Barbara Jimenez is a 56 y.o. female with a pmh significant for hyperlipidemia, longstanding dysphagia with recent findings of acute esophagitis and esophageal stricturing.  The patient presents to the [facility] for an evaluation and management of problem(s) noted below: ? ?Problem List ?1. Esophageal stricture   ?2. Esophageal dysphagia   ?3. Acute esophagitis   ?4. Esophageal eosinophilia   ?5. Abnormal endoscopy of upper gastrointestinal tract   ? ? ?History of Present Illness ?The patient presents for follow-up.  I met the patient in the hospital approximately 1 month ago when she presented with acute food bolus impaction and longer standing dysphagia.  Her endoscopy results showed evidence of significant esophagitis distally as well as an esophageal stricture.  She was initiated on PPI therapy as well as Carafate therapy.  Patient states that she has been doing relatively okay since her hospitalization.  She is monitoring what she is eating and chewing very thoroughly before swallowing.  She continues to deny any issues of true pyrosis symptoms.  No issues from a liquid standpoint.  She is hopeful to have continued improvement moving forward in the coming months.  She reports a previous colonoscopy in 2019 or 2018 that was reportedly normal done [place] and we will try to work on obtaining those records but was told the 10-year follow-up would be needed. ? ?GI Review of Systems ?Positive as above ?Negative for dyne aphasia, pain, change in bowel habits, melena, hematochezia ? ?Review of Systems ?General: Denies fevers/chills/weight loss unintentionally ?HEENT: Denies oral lesions ?Cardiovascular: Denies chest pain ?Pulmonary: Denies shortness of breath ?Gastroenterological: See HPI ?Genitourinary:  Denies darkened urine ?Hematological: Denies easy bruising/bleeding ?Dermatological: Denies jaundice ?Psychological: Mood is stable ? ? ?Medications ?Current Outpatient Medications  ?Medication Sig Dispense Refill  ? AMBULATORY NON FORMULARY MEDICATION Viviscal herbal supplement ?1 tablet by mouth twice daily    ? bimatoprost (LATISSE) 0.03 % ophthalmic solution Place 1 drop into both eyes as needed.    ? cyclobenzaprine (FLEXERIL) 10 MG tablet Take 10 mg by mouth every 8 (eight) hours as needed.    ? estradiol (ESTRACE) 2 MG tablet Take 2 mg by mouth daily.    ? omeprazole (PRILOSEC) 40 MG capsule Take 1 capsule (40 mg total) by mouth daily. 60 capsule 12  ? progesterone (PROMETRIUM) 100 MG capsule Take 100 mg by mouth at bedtime.    ? rosuvastatin (CRESTOR) 10 MG tablet Take 10 mg by mouth daily.    ? sucralfate (CARAFATE) 1 GM/10ML suspension Take 10 mLs (1 g total) by mouth 2 (two) times daily. 420 mL 12  ? MAGNESIUM PO Take by mouth. (Patient not taking: Reported on 06/08/2021)    ? ?No current facility-administered medications for this visit.  ? ? ?Allergies ?Allergies  ?Allergen Reactions  ? Codeine Itching  ? ? ?Histories ?Past Medical History:  ?Diagnosis Date  ? Hypercholesteremia   ? PONV (postoperative nausea and vomiting)   ? ?Past Surgical History:  ?Procedure Laterality Date  ? BIOPSY  05/08/2021  ? Procedure: BIOPSY;  Surgeon: Irving Copas., MD;  Location: [place];  Service: Gastroenterology;;  ? COLONOSCOPY    ? ECTOPIC PREGNANCY SURGERY    ? ESOPHAGEAL DILATION    ? ESOPHAGOGASTRODUODENOSCOPY N/A 05/08/2021  ? Procedure: ESOPHAGOGASTRODUODENOSCOPY (EGD);  Surgeon: Rush Landmark Telford Nab., MD;  Location: [place];  Service: Gastroenterology;  Laterality: N/A;  ? FOREIGN BODY REMOVAL  05/08/2021  ? Procedure: FOREIGN BODY REMOVAL;  Surgeon: Rush Landmark Telford Nab., MD;  Location: [place];  Service: Gastroenterology;;  ? ?Social History  ? ?Socioeconomic History  ? Marital status:  Married  ?  Spouse name: Not on file  ? Number of children: Not on file  ? Years of education: Not on file  ? Highest education level: Not on file  ?Occupational History  ? Not on file  ?Tobacco Use  ? Smoking status: Never  ? Smokeless tobacco: Never  ?Substance and Sexual Activity  ? Alcohol use: Yes  ? Drug use: Not Currently  ? Sexual activity: Not on file  ?Other Topics Concern  ? Not on file  ?Social History Narrative  ? Not on file  ? ?Social Determinants of Health  ? ?Financial Resource Strain: Not on file  ?Food Insecurity: Not on file  ?Transportation Needs: Not on file  ?Physical Activity: Not on file  ?Stress: Not on file  ?Social Connections: Not on file  ?Intimate Partner Violence: Not on file  ? ?Family History  ?Problem Relation Age of Onset  ? Colon cancer Neg Hx   ? Esophageal cancer Neg Hx   ? Inflammatory bowel disease Neg Hx   ? Liver disease Neg Hx   ? Pancreatic cancer Neg Hx   ? Rectal cancer Neg Hx   ? Stomach cancer Neg Hx   ? ?I have reviewed her medical, social, and family history in detail and updated the electronic medical record as necessary.  ? ? ?PHYSICAL EXAMINATION  ?BP 114/72   Pulse 75   Ht _0  (1.702 m) Comment: height measured without shoes  Wt 172 lb 2 oz (78.1 kg)   BMI 26.96 kg/m?  ?Wt Readings from Last 3 Encounters:  ?06/08/21 172 lb 2 oz (78.1 kg)  ?05/08/21 167 lb (75.8 kg)  ?GEN: NAD, appears stated age, doesn't appear chronically ill ?PSYCH: Cooperative, without pressured speech ?EYE: Conjunctivae pink, sclerae anicteric ?ENT: MMM ?CV: Nontachycardic ?RESP: No audible wheezing ?GI: NABS, soft, NT/ND, without rebound ?MSK/EXT: No lower extremity edema ?SKIN: No jaundice ?NEURO:  Alert & Oriented x 3, no focal deficits ? ? ?REVIEW OF DATA  ?I reviewed the following data at the time of this encounter: ? ?GI Procedures and Studies  ?3/23 EGD ?- No gross lesions in esophagus proximally. Biopsied. ?- Food/pillstuff was found in the esophagus distally. Removal was  successful. LA Grade D erosive esophagitis with no bleeding was revealed distally. Biopsied. ?- Low-grade of narrowing Schatzki ring vs peptic stricturing noted just below the foodstuffs. ?- Z-line irregular, 39 cm from the incisors. ?- 4 cm hiatal hernia. ?- Erythematous mucosa in the stomach. Biopsied. ?- No gross lesions in the duodenal bulb, in the first portion of the duodenum and in the second portion of the duodenum. ? ?Pathology ?FINAL MICROSCOPIC DIAGNOSIS:  ?A. STOMACH, BIOPSY:  ?Reactive gastropathy with minimal chronic gastritis  ?Negative for H. pylori, intestinal metaplasia, dysplasia and carcinoma  ?B. GE JUNCTION, BIOPSY:  ?Acute esophagitis with focal erosion and eosinophilia (greater than 30 EOS/hpf) (see comment)  ?Negative for glandular epithelium, dysplasia and carcinoma  ?Negative for viral cytopathic effect and fungal organisms  ?C. ESOPHAGUS, RANDOM, BIOPSY:  ?Reactive squamous mucosa  ?Negative for eosinophils, glandular epithelium, dysplasia and carcinoma  ?COMMENT:  ?The gastroesophageal junction biopsy (B) showed reactive squamous mucosa exhibiting elongation of papillae, basal zone hyperplasia and surface parakeratosis within one fragment there  is a marked neutrophilic infiltrate within the mid to deep epithelium.  Focally the surface appears eroded.  Within the acute inflammatory cell infiltrate, there are frequent eosinophils.  No viral cytopathic effect is seen.  No fungal organisms are identified.  No glandular epithelium is present. There is no evidence of dysplasia or carcinoma.  ? ?Laboratory Studies  ?Reviewed those in epic ? ?Imaging Studies  ?No relevant studies to review ? ? ?ASSESSMENT  ?Ms. Veith is a 56 y.o. female with a pmh significant for hyperlipidemia, longstanding dysphagia with recent findings of acute esophagitis and esophageal stricturing.  The patient is seen today for evaluation and management of: ? ?1. Esophageal stricture   ?2. Esophageal dysphagia   ?3. Acute  esophagitis   ?4. Esophageal eosinophilia   ?5. Abnormal endoscopy of upper gastrointestinal tract   ? ?The patient is hemodynamically and clinically stable.  Compared to where things were before her endoscopy she

## 2021-06-08 NOTE — Patient Instructions (Signed)
You have been scheduled for an endoscopy. Please follow written instructions given to you at your visit today. ?If you use inhalers (even only as needed), please bring them with you on the day of your procedure. ? ?Thank you for choosing me and [facility]. ? ?Dr. Rush Landmark ? ?

## 2021-06-10 ENCOUNTER — Encounter: Payer: Self-pay | Admitting: Gastroenterology

## 2021-06-10 DIAGNOSIS — K200 Eosinophilic esophagitis: Secondary | ICD-10-CM | POA: Insufficient documentation

## 2021-06-10 DIAGNOSIS — K209 Esophagitis, unspecified without bleeding: Secondary | ICD-10-CM | POA: Insufficient documentation

## 2021-06-10 DIAGNOSIS — R1319 Other dysphagia: Secondary | ICD-10-CM | POA: Insufficient documentation

## 2021-06-10 DIAGNOSIS — R933 Abnormal findings on diagnostic imaging of other parts of digestive tract: Secondary | ICD-10-CM | POA: Insufficient documentation

## 2021-06-10 DIAGNOSIS — K222 Esophageal obstruction: Secondary | ICD-10-CM | POA: Insufficient documentation

## 2021-07-10 ENCOUNTER — Ambulatory Visit (AMBULATORY_SURGERY_CENTER): Payer: 59 | Admitting: Gastroenterology

## 2021-07-10 ENCOUNTER — Encounter: Payer: Self-pay | Admitting: Gastroenterology

## 2021-07-10 VITALS — BP 121/56 | HR 58 | Temp 97.3°F | Resp 15 | Ht 67.0 in | Wt 172.0 lb

## 2021-07-10 DIAGNOSIS — R12 Heartburn: Secondary | ICD-10-CM

## 2021-07-10 DIAGNOSIS — R1310 Dysphagia, unspecified: Secondary | ICD-10-CM | POA: Diagnosis not present

## 2021-07-10 DIAGNOSIS — K222 Esophageal obstruction: Secondary | ICD-10-CM

## 2021-07-10 DIAGNOSIS — K229 Disease of esophagus, unspecified: Secondary | ICD-10-CM | POA: Diagnosis not present

## 2021-07-10 DIAGNOSIS — K449 Diaphragmatic hernia without obstruction or gangrene: Secondary | ICD-10-CM | POA: Diagnosis not present

## 2021-07-10 DIAGNOSIS — K200 Eosinophilic esophagitis: Secondary | ICD-10-CM | POA: Diagnosis not present

## 2021-07-10 DIAGNOSIS — K2950 Unspecified chronic gastritis without bleeding: Secondary | ICD-10-CM | POA: Diagnosis not present

## 2021-07-10 MED ORDER — SODIUM CHLORIDE 0.9 % IV SOLN: 500.0000 mL | Freq: Once | INTRAVENOUS | Status: DC

## 2021-07-10 NOTE — Progress Notes (Signed)
GASTROENTEROLOGY PROCEDURE H&P NOTE   Primary Care Physician: Estil Daft, MD  HPI: Barbara Jimenez is a 56 y.o. female who presents for EGD for follow up esophagitis and likely dilation for history of dysphagia.  Past Medical History:  Diagnosis Date   GERD (gastroesophageal reflux disease)    Hypercholesteremia    PONV (postoperative nausea and vomiting)    Past Surgical History:  Procedure Laterality Date   BIOPSY  05/08/2021   Procedure: BIOPSY;  Surgeon: Rush Landmark Telford Nab., MD;  Location: [place];  Service: Gastroenterology;;   COLONOSCOPY     ECTOPIC PREGNANCY SURGERY     ESOPHAGEAL DILATION     ESOPHAGOGASTRODUODENOSCOPY N/A 05/08/2021   Procedure: ESOPHAGOGASTRODUODENOSCOPY (EGD);  Surgeon: Irving Copas., MD;  Location: [place];  Service: Gastroenterology;  Laterality: N/A;   FOREIGN BODY REMOVAL  05/08/2021   Procedure: FOREIGN BODY REMOVAL;  Surgeon: Irving Copas., MD;  Location: [place];  Service: Gastroenterology;;   Current Outpatient Medications  Medication Sig Dispense Refill   AMBULATORY NON FORMULARY MEDICATION Viviscal herbal supplement 1 tablet by mouth twice daily     bimatoprost (LATISSE) 0.03 % ophthalmic solution Place 1 drop into both eyes as needed.     estradiol (ESTRACE) 2 MG tablet Take 2 mg by mouth daily.     omeprazole (PRILOSEC) 40 MG capsule Take 1 capsule (40 mg total) by mouth daily. 60 capsule 12   progesterone (PROMETRIUM) 100 MG capsule Take 100 mg by mouth at bedtime.     rosuvastatin (CRESTOR) 10 MG tablet Take 10 mg by mouth daily.     sucralfate (CARAFATE) 1 GM/10ML suspension Take 10 mLs (1 g total) by mouth 2 (two) times daily. 420 mL 12   cyclobenzaprine (FLEXERIL) 10 MG tablet Take 10 mg by mouth every 8 (eight) hours as needed.     MAGNESIUM PO Take by mouth. (Patient not taking: Reported on 06/08/2021)     Current Facility-Administered Medications  Medication Dose Route Frequency  Provider Last Rate Last Admin   0.9 %  sodium chloride infusion  500 mL Intravenous Once Mansouraty, Telford Nab., MD        Current Outpatient Medications:    AMBULATORY NON FORMULARY MEDICATION, Viviscal herbal supplement 1 tablet by mouth twice daily, Disp: , Rfl:    bimatoprost (LATISSE) 0.03 % ophthalmic solution, Place 1 drop into both eyes as needed., Disp: , Rfl:    estradiol (ESTRACE) 2 MG tablet, Take 2 mg by mouth daily., Disp: , Rfl:    omeprazole (PRILOSEC) 40 MG capsule, Take 1 capsule (40 mg total) by mouth daily., Disp: 60 capsule, Rfl: 12   progesterone (PROMETRIUM) 100 MG capsule, Take 100 mg by mouth at bedtime., Disp: , Rfl:    rosuvastatin (CRESTOR) 10 MG tablet, Take 10 mg by mouth daily., Disp: , Rfl:    sucralfate (CARAFATE) 1 GM/10ML suspension, Take 10 mLs (1 g total) by mouth 2 (two) times daily., Disp: 420 mL, Rfl: 12   cyclobenzaprine (FLEXERIL) 10 MG tablet, Take 10 mg by mouth every 8 (eight) hours as needed., Disp: , Rfl:    MAGNESIUM PO, Take by mouth. (Patient not taking: Reported on 06/08/2021), Disp: , Rfl:   Current Facility-Administered Medications:    0.9 %  sodium chloride infusion, 500 mL, Intravenous, Once, Mansouraty, Telford Nab., MD Allergies  Allergen Reactions   Codeine Itching   Family History  Problem Relation Age of Onset   Colon cancer Neg Hx  Esophageal cancer Neg Hx    Inflammatory bowel disease Neg Hx    Liver disease Neg Hx    Pancreatic cancer Neg Hx    Rectal cancer Neg Hx    Stomach cancer Neg Hx    Social History   Socioeconomic History   Marital status: Married    Spouse name: Not on file   Number of children: Not on file   Years of education: Not on file   Highest education level: Not on file  Occupational History   Not on file  Tobacco Use   Smoking status: Former    Packs/day: 1.00    Years: 15.00    Pack years: 15.00    Types: Cigarettes    Quit date: 02/12/1996    Years since quitting: 25.4   Smokeless  tobacco: Never  Vaping Use   Vaping Use: Never used  Substance and Sexual Activity   Alcohol use: Yes   Drug use: Not Currently   Sexual activity: Not on file  Other Topics Concern   Not on file  Social History Narrative   Not on file   Social Determinants of Health   Financial Resource Strain: Not on file  Food Insecurity: Not on file  Transportation Needs: Not on file  Physical Activity: Not on file  Stress: Not on file  Social Connections: Not on file  Intimate Partner Violence: Not on file    Physical Exam: Today's Vitals   07/10/21 0808 07/10/21 0818  BP: 122/66   Pulse: 66   Temp: (!) 97.3 F (36.3 C) (!) 97.3 F (36.3 C)  TempSrc: Temporal   SpO2: 100%   Weight: 172 lb (78 kg)   Height: '5\' 7"'$  (1.702 m)    Body mass index is 26.94 kg/m. GEN: NAD EYE: Sclerae anicteric ENT: MMM CV: Non-tachycardic GI: Soft, NT/ND NEURO:  Alert & Oriented x 3  Lab Results: No results for input(s): WBC, HGB, HCT, PLT in the last 72 hours. BMET No results for input(s): NA, K, CL, CO2, GLUCOSE, BUN, CREATININE, CALCIUM in the last 72 hours. LFT No results for input(s): PROT, ALBUMIN, AST, ALT, ALKPHOS, BILITOT, BILIDIR, IBILI in the last 72 hours. PT/INR No results for input(s): LABPROT, INR in the last 72 hours.   Impression / Plan: This is a 56 y.o.female who presents for EGD for follow up esophagitis and likely dilation for history of dysphagia.  The risks and benefits of endoscopic evaluation/treatment were discussed with the patient and/or family; these include but are not limited to the risk of perforation, infection, bleeding, missed lesions, lack of diagnosis, severe illness requiring hospitalization, as well as anesthesia and sedation related illnesses.  The patient's history has been reviewed, patient examined, no change in status, and deemed stable for procedure.  The patient and/or family is agreeable to proceed.    Justice Britain, MD [facility] Advanced Endoscopy Office # [phone]

## 2021-07-10 NOTE — Patient Instructions (Addendum)
Handouts provided on hiatal hernia and post-dilation diet.   Please use Cepacol or [place] Lozenges and/or Chloraseptic spray for the next 72-96 hours to aid in sore throat, should you experience this.   Return to GI clinic in 2 months.  Continue present medications, including omeprazole (acid medication). You may stop taking the Carafate if you would like.   YOU HAD AN ENDOSCOPIC PROCEDURE TODAY AT [facility]:   Refer to the procedure report that was given to you for any specific questions about what was found during the examination.  If the procedure report does not answer your questions, please call your gastroenterologist to clarify.  If you requested that your care partner not be given the details of your procedure findings, then the procedure report has been included in a sealed envelope for you to review at your convenience later.  YOU SHOULD EXPECT: Some feelings of bloating in the abdomen. Passage of more gas than usual.  Walking can help get rid of the air that was put into your GI tract during the procedure and reduce the bloating. If you had a lower endoscopy (such as a colonoscopy or flexible sigmoidoscopy) you may notice spotting of blood in your stool or on the toilet paper. If you underwent a bowel prep for your procedure, you may not have a normal bowel movement for a few days.  Please Note:  You might notice some irritation and congestion in your nose or some drainage.  This is from the oxygen used during your procedure.  There is no need for concern and it should clear up in a day or so.  SYMPTOMS TO REPORT IMMEDIATELY:  Following upper endoscopy (EGD)  Vomiting of blood or coffee ground material  New chest pain or pain under the shoulder blades  Painful or persistently difficult swallowing  New shortness of breath  Fever of 100F or higher  Black, tarry-looking stools  For urgent or emergent issues, a gastroenterologist can be reached at any hour by calling  [phone]. Do not use MyChart messaging for urgent concerns.    DIET:  Post-dilation diet: Clear liquids for 2 hours (until 1145 am). Then advance to a Soft diet (see handout) for the rest of today. Advance as tolerated to your regular diet tomorrow. Drink plenty of fluids but you should avoid alcoholic beverages for 24 hours.  ACTIVITY:  You should plan to take it easy for the rest of today and you should NOT DRIVE or use heavy machinery until tomorrow (because of the sedation medicines used during the test).    FOLLOW UP: Our staff will call the number listed on your records 48-72 hours following your procedure to check on you and address any questions or concerns that you may have regarding the information given to you following your procedure. If we do not reach you, we will leave a message.  We will attempt to reach you two times.  During this call, we will ask if you have developed any symptoms of COVID 19. If you develop any symptoms (ie: fever, flu-like symptoms, shortness of breath, cough etc.) before then, please call [phone].  If you test positive for Covid 19 in the 2 weeks post procedure, please call and report this information to [place].    If any biopsies were taken you will be contacted by phone or by letter within the next 1-3 weeks.  Please call us at [phone] if you have not heard about the biopsies in 3 weeks.  SIGNATURES/CONFIDENTIALITY: You and/or your care partner have signed paperwork which will be entered into your electronic medical record.  These signatures attest to the fact that that the information above on your After Visit Summary has been reviewed and is understood.  Full responsibility of the confidentiality of this discharge information lies with you and/or your care-partner.

## 2021-07-10 NOTE — Progress Notes (Signed)
Called to room to assist during endoscopic procedure.  Patient ID and intended procedure confirmed with present staff. Received instructions for my participation in the procedure from the performing physician.  

## 2021-07-10 NOTE — Progress Notes (Signed)
To pacu, VSS. Report to [email or web address] 

## 2021-07-10 NOTE — Op Note (Signed)
[facility] Patient Name: Barbara Jimenez Procedure Date: 07/10/2021 9:25 AM MRN: [id number] Endoscopist: Justice Britain , MD Age: 56 Referring MD:  Date of Birth: 1965-09-25 Gender: Female Account #: [id number] Procedure:                Upper GI endoscopy Indications:              Dysphagia, Heartburn, Follow-up of esophagitis                            (Grade D), history of previous Food impaction of                            esophagus Medicines:                Monitored Anesthesia Care Procedure:                Pre-Anesthesia Assessment:                           - Prior to the procedure, a History and Physical                            was performed, and patient medications and                            allergies were reviewed. The patient's tolerance of                            previous anesthesia was also reviewed. The risks                            and benefits of the procedure and the sedation                            options and risks were discussed with the patient.                            All questions were answered, and informed consent                            was obtained. Prior Anticoagulants: The patient has                            taken no previous anticoagulant or antiplatelet                            agents. ASA Grade Assessment: II - A patient with                            mild systemic disease. After reviewing the risks                            and benefits, the patient was deemed in  satisfactory condition to undergo the procedure.                           After obtaining informed consent, the endoscope was                            passed under direct vision. Throughout the                            procedure, the patient's blood pressure, pulse, and                            oxygen saturations were monitored continuously. The                            Endoscope was introduced through the mouth, and                             advanced to the second part of duodenum. The upper                            GI endoscopy was accomplished without difficulty.                            The patient tolerated the procedure. Scope In: Scope Out: Findings:                 No gross lesions were noted in the entire esophagus.                           The Z-line was regular and was found 39 cm from the                            incisors.                           A non-obstructing Schatzki ring was found at the                            gastroesophageal junction. Biopsies were taken with                            a cold forceps for disruption purposes                            circumferentially around the ring.                           After the rest of the EGD was complete, a guidewire                            was placed and the scope was withdrawn. Dilation                            was  performed in the esophagus with a Savary                            dilator with no resistance at 18 mm and mild                            resistance at 19 mm. The esophagus was examined                            following endoscope reinsertion and showed mild                            mucosal disruption (mucosal wrent just below the                            UES), mild improvement in luminal narrowing and no                            perforation.                           A 2 cm hiatal hernia was present.                           No gross lesions were noted in the entire examined                            stomach.                           No gross lesions were noted in the duodenal bulb,                            in the first portion of the duodenum and in the                            second portion of the duodenum. Complications:            No immediate complications. Estimated Blood Loss:     Estimated blood loss was minimal. Impression:               - No gross lesions in esophagus.  Z-line regular, 39                            cm from the incisors.                           - Non-obstructing Schatzki ring - disrupted.                           - Dilation performed in the esophagus. Mucosal                            wrent noted just distal to UES. No perforation.                           -  2 cm hiatal hernia.                           - No gross lesions in the stomach.                           - No gross lesions in the duodenal bulb, in the                            first portion of the duodenum and in the second                            portion of the duodenum. Recommendation:           - The patient will be observed post-procedure,                            until all discharge criteria are met.                           - Discharge patient to home.                           - Patient has a contact number available for                            emergencies. The signs and symptoms of potential                            delayed complications were discussed with the                            patient. Return to normal activities tomorrow.                            Written discharge instructions were provided to the                            patient.                           - Dilation diet as per protocol.                           - Please use Cepacol or Halls Lozenges +/-                            Chloraseptic spray for next 72-96 hours to aid in                            sore thoat should you experience this.                           - Continue present medications.                           -  Repeat upper endoscopy PRN for retreatment.                           - If dysphagia symptoms persist/recur quickly, then                            would consider manometry. Less likely felt to have                            a true motility disorder, but if we were to need to                            consider [facility] repair in future, would be necessary.                            - Return to GI clinic in 2 months.                           - The findings and recommendations were discussed                            with the patient.                           - The findings and recommendations were discussed                            with the patient's family. Justice Britain, MD 07/10/2021 9:49:45 AM

## 2021-07-11 ENCOUNTER — Telehealth: Payer: Self-pay

## 2021-07-11 NOTE — Telephone Encounter (Signed)
  Follow up Call-     07/10/2021    8:18 AM  Call back number  Post procedure Call Back phone  # [phone]  Permission to leave phone message Yes     Patient questions:  Do you have a fever, pain , or abdominal swelling? No. Pain Score  0 *  Have you tolerated food without any problems? Yes.    Have you been able to return to your normal activities? Yes.    Do you have any questions about your discharge instructions: Diet   No. Medications  No. Follow up visit  No.  Do you have questions or concerns about your Care? No.  Actions: * If pain score is 4 or above: No action needed, pain <4.

## 2021-07-18 ENCOUNTER — Encounter: Payer: Self-pay | Admitting: Gastroenterology

## 2021-08-27 ENCOUNTER — Other Ambulatory Visit (INDEPENDENT_AMBULATORY_CARE_PROVIDER_SITE_OTHER): Payer: 59

## 2021-08-27 ENCOUNTER — Telehealth: Payer: Self-pay | Admitting: Gastroenterology

## 2021-08-27 ENCOUNTER — Other Ambulatory Visit: Payer: Self-pay

## 2021-08-27 DIAGNOSIS — R1013 Epigastric pain: Secondary | ICD-10-CM

## 2021-08-27 DIAGNOSIS — R112 Nausea with vomiting, unspecified: Secondary | ICD-10-CM

## 2021-08-27 DIAGNOSIS — R1011 Right upper quadrant pain: Secondary | ICD-10-CM | POA: Diagnosis not present

## 2021-08-27 DIAGNOSIS — B179 Acute viral hepatitis, unspecified: Secondary | ICD-10-CM

## 2021-08-27 DIAGNOSIS — R7989 Other specified abnormal findings of blood chemistry: Secondary | ICD-10-CM

## 2021-08-27 LAB — COMPREHENSIVE METABOLIC PANEL
ALT: 34 U/L (ref 0–35)
AST: 21 U/L (ref 0–37)
Albumin: 4.5 g/dL (ref 3.5–5.2)
Alkaline Phosphatase: 87 U/L (ref 39–117)
BUN: 12 mg/dL (ref 6–23)
CO2: 26 mEq/L (ref 19–32)
Calcium: 12 mg/dL — ABNORMAL HIGH (ref 8.4–10.5)
Chloride: 107 mEq/L (ref 96–112)
Creatinine, Ser: 0.91 mg/dL (ref 0.40–1.20)
GFR: 70.1 mL/min (ref >60.00)
Glucose, Bld: 110 mg/dL — ABNORMAL HIGH (ref 70–99)
Potassium: 4.7 mEq/L (ref 3.5–5.1)
Sodium: 140 mEq/L (ref 135–145)
Total Bilirubin: 0.4 mg/dL (ref 0.2–1.2)
Total Protein: 6.8 g/dL (ref 6.0–8.3)

## 2021-08-27 LAB — CBC WITH DIFFERENTIAL/PLATELET
Basophils Absolute: 0 10*3/uL (ref 0.0–0.1)
Basophils Relative: 0.9 % (ref 0.0–3.0)
Eosinophils Absolute: 0.4 10*3/uL (ref 0.0–0.7)
Eosinophils Relative: 6.8 % — ABNORMAL HIGH (ref 0.0–5.0)
HCT: 45.6 % (ref 36.0–46.0)
Hemoglobin: 15.2 g/dL — ABNORMAL HIGH (ref 12.0–15.0)
Lymphocytes Relative: 31.4 % (ref 12.0–46.0)
Lymphs Abs: 1.8 10*3/uL (ref 0.7–4.0)
MCHC: 33.2 g/dL (ref 30.0–36.0)
MCV: 94.4 fl (ref 78.0–100.0)
Monocytes Absolute: 0.4 10*3/uL (ref 0.1–1.0)
Monocytes Relative: 7.2 % (ref 3.0–12.0)
Neutro Abs: 3.1 10*3/uL (ref 1.4–7.7)
Neutrophils Relative %: 53.7 % (ref 43.0–77.0)
Platelets: 231 10*3/uL (ref 150.0–400.0)
RBC: 4.84 Mil/uL (ref 3.87–5.11)
RDW: 13.3 % (ref 11.5–15.5)
WBC: 5.7 10*3/uL (ref 4.0–10.5)

## 2021-08-27 LAB — AMYLASE: Amylase: 41 U/L (ref 27–131)

## 2021-08-27 LAB — LIPASE: Lipase: 27 U/L (ref 11.0–59.0)

## 2021-08-27 LAB — HIGH SENSITIVITY CRP: CRP, High Sensitivity: 0.47 mg/L (ref 0.000–5.000)

## 2021-08-27 LAB — SEDIMENTATION RATE: Sed Rate: 4 mm/hr (ref 0–30)

## 2021-08-27 MED ORDER — ONDANSETRON 4 MG PO TBDP: 4.0000 mg | ORAL_TABLET | Freq: Three times a day (TID) | ORAL | 1 refills | Status: DC | PRN

## 2021-08-27 NOTE — Telephone Encounter (Signed)
Received a message that patient was having issues.  The patient states that over the course of the last couple of weeks she has been experiencing increased issues of abdominal discomfort in the mid epigastrium and right upper quadrant.  Over the last week things have been worse.  She had seen her gynecologist last week who had done some laboratory testing (we do not have access to it but we will try to get it-[facility]) and she states that she was noted to have elevations in her liver test (at least 2 times upper limit of normal from what she describes). The patient has had progressive nausea and some minor episodes of vomiting.  She has chronic constipation and has not experienced any changes in her normal bowel habits.  As result of the anorexia she is only tolerating liquids at this time. Based on the patient's particular symptoms and the elevations in liver test, I worry as to whether some of her symptoms may be from an acute hepatitis versus gallbladder pathology versus other biliary duct issue. We do not have any updated imaging at this time. In this particular setting it is reasonable for [place] to expedite neck steps in her evaluation with laboratories and imaging.  Here is the plan: 1) laboratory evaluation-CBC/CMP/ESR/CRP/amylase/lipase to be obtained and send also a hepatitis B IgM total antibody, hepatitis a IgG total antibody-send as stat 2) obtain laboratories from Louretta Shorten from [facility] (which included an acute hepatitis panel per her report) 3) proceed with ordering stat CT abdomen/pelvis with IV and oral contrast (will have creatinine from today's labs) 4) continue aggressive hydration with Gatorade/Powerade/Pedialyte as you are doing 5) Zofran 4 mg every 8 hour ODT (30 tablets 1 refill) 6) if things progress or worsen and she cannot tolerate things or if laboratories are significantly elevated she may end up needing to go to the emergency  department/hospital.  Patty, Please work on expediting the laboratories and the CT imaging diagnoses will be midepigastric abdominal pain/right upper quadrant abdominal pain, abnormal LFTs, acute hepatitis, nausea, vomiting   Thanks. GM

## 2021-08-27 NOTE — Telephone Encounter (Signed)
The pt has been advised and will come in for labs now.  She will call back if she has not heard from the schedulers in regards to the CT within the next hour.  She will stay hydrated and pick  up zofran.     Dr Rush Landmark the labs from Dr Corinna Capra are under the [facility] tab.

## 2021-08-27 NOTE — Addendum Note (Signed)
Addended by: Timothy Lasso on: 08/27/2021 10:57 AM   Modules accepted: Orders

## 2021-08-27 NOTE — Telephone Encounter (Signed)
Labs entered (Hep B IGM-Hep-B IGG stat) CT order entered and sent to the schedulers to be done ASAP Labs from [facility] under lab dexa tab  Zofran sent to the pharmacy  Pt to stay hydrated

## 2021-08-27 NOTE — Telephone Encounter (Signed)
Thanks Barbara Jimenez for the update. I have reviewed the labs as follows: AST/ALT 83/60 Alk phos 104 T. bili 0.2 Hepatitis A IgM negative Hepatitis B surface antigen negative Hepatitis B core IgM negative Hepatitis C antibody negative Lets see what her updated labs look like. GM

## 2021-08-28 ENCOUNTER — Ambulatory Visit
Admission: RE | Admit: 2021-08-28 | Discharge: 2021-08-28 | Disposition: A | Discharge status: Home / Self Care | Payer: 59 | Source: Ambulatory Visit | Attending: Gastroenterology | Admitting: Gastroenterology

## 2021-08-28 DIAGNOSIS — R1013 Epigastric pain: Secondary | ICD-10-CM

## 2021-08-28 DIAGNOSIS — R7989 Other specified abnormal findings of blood chemistry: Secondary | ICD-10-CM

## 2021-08-28 DIAGNOSIS — B179 Acute viral hepatitis, unspecified: Secondary | ICD-10-CM

## 2021-08-28 DIAGNOSIS — R112 Nausea with vomiting, unspecified: Secondary | ICD-10-CM

## 2021-08-28 DIAGNOSIS — R1011 Right upper quadrant pain: Secondary | ICD-10-CM

## 2021-08-28 LAB — HEPATITIS B CORE ANTIBODY, IGM: Hep B C IgM: NONREACTIVE

## 2021-08-28 LAB — HEPATITIS A ANTIBODY, TOTAL: Hepatitis A AB,Total: NONREACTIVE

## 2021-08-28 MED ORDER — IOPAMIDOL (ISOVUE-300) INJECTION 61%
100.0000 mL | Freq: Once | INTRAVENOUS | Status: AC | PRN
  Administered 2021-08-28: 100 mL via INTRAVENOUS

## 2021-08-28 NOTE — Telephone Encounter (Signed)
auth# [id number] good 08/28/21-10/12/21 for STAT CT  [address] NPO arrive ASAP.    The pt has been advised and will head to [place] now for CT<>

## 2021-08-29 ENCOUNTER — Encounter: Payer: Self-pay | Admitting: Gastroenterology

## 2021-08-29 ENCOUNTER — Ambulatory Visit: Payer: 59 | Admitting: Gastroenterology

## 2021-08-29 VITALS — BP 106/68 | HR 82 | Ht 67.0 in | Wt 160.0 lb

## 2021-08-29 DIAGNOSIS — K5909 Other constipation: Secondary | ICD-10-CM

## 2021-08-29 DIAGNOSIS — R7989 Other specified abnormal findings of blood chemistry: Secondary | ICD-10-CM

## 2021-08-29 DIAGNOSIS — R110 Nausea: Secondary | ICD-10-CM

## 2021-08-29 DIAGNOSIS — R630 Anorexia: Secondary | ICD-10-CM | POA: Insufficient documentation

## 2021-08-29 DIAGNOSIS — R634 Abnormal weight loss: Secondary | ICD-10-CM | POA: Insufficient documentation

## 2021-08-29 DIAGNOSIS — R1114 Bilious vomiting: Secondary | ICD-10-CM | POA: Insufficient documentation

## 2021-08-29 DIAGNOSIS — R1013 Epigastric pain: Secondary | ICD-10-CM

## 2021-08-29 DIAGNOSIS — R140 Abdominal distension (gaseous): Secondary | ICD-10-CM

## 2021-08-29 DIAGNOSIS — R1319 Other dysphagia: Secondary | ICD-10-CM

## 2021-08-29 MED ORDER — SUCRALFATE 1 GM/10ML PO SUSP: 1.0000 g | Freq: Three times a day (TID) | ORAL | 5 refills | Status: AC

## 2021-08-29 NOTE — Patient Instructions (Addendum)
You will be contacted by [facility] in the next 7 days to arrange a RUQ ultrasound.  The number on your caller ID will be [phone], please answer when they call.  If you have not heard from them in 7 days please call [phone] to schedule.    You have been given a testing kit to check for small intestine bacterial overgrowth (SIBO) which is completed by a company named [facility]. Make sure to return your test in the mail using the return mailing label given to you along with the kit. Your demographic and insurance information have already been sent to the company and they should be in contact with you over the next 1-2 weeks regarding this test. [facility] will collect an upfront charge of $99.74 for commercial insurance plans and $209.74 is you are paying cash. Make sure to discuss with [facility] PRIOR to having the test to see if they have gotten information from your insurance company as to how much your testing will cost out of pocket, if any. Please keep in mind that you will be getting a call from phone number [phone] or a similar number. If you do not hear from them within this time frame, please call our office at [phone] or call [facility] directly at [phone].   We have sent the following medications to your pharmacy for you to pick up at your convenience: Carafate to take 3-4 x daily.  Restart your over the counter magnesium.  May take samples of FDgard 1-2 daily around dinner time. This can be purchased over the counter.   Please MyChart message in one week with symptom update.  The [facility] providers would like to encourage you to use [facility] to communicate with providers for non-urgent requests or questions.  Due to long hold times on the telephone, sending your provider a message by [facility] may be a faster and more efficient way to get a response.  Please allow 48 business hours for a response.  Please remember that this  is for non-urgent requests.   Due to recent changes in healthcare laws, you may see the results of your imaging and laboratory studies on MyChart before your provider has had a chance to review them.  We understand that in some cases there may be results that are confusing or concerning to you. Not all laboratory results come back in the same time frame and the provider may be waiting for multiple results in order to interpret others.  Please give [place] 48 hours in order for your provider to thoroughly review all the results before contacting the office for clarification of your results.        MyChart in one week

## 2021-08-29 NOTE — Progress Notes (Signed)
[facility] VISIT   Primary Care Provider Estil Daft, MD [place] [address] [phone]  Patient Profile: Barbara Jimenez is a 56 y.o. female with a pmh significant for hyperlipidemia, GERD (with prior esophagitis and stricturing-status post dilation), chronic constipation.  The patient presents to the [facility] for an evaluation and management of problem(s) noted below:  Problem List 1. Epigastric pain   2. Dyspepsia   3. Nausea   4. Anorexia   5. Unintentional weight loss   6. Bloating   7. Abnormal LFTs   8. Esophageal dysphagia   9. Chronic constipation     History of Present Illness Please see prior notes for full details of HPI.  Interval History The patient had reached out to [place] a few days ago in the setting of some progressive issues which led to updated labs and imaging.  She describes that over the course the last 2-1/2 to 3 weeks she has had a 6 to 8 pound weight loss in the setting of anorexia with progressive abdominal discomfort that worsens after eating.  She has had bloating and distention as well.  She has had increasing nausea without vomiting.  She has had increased fatigue as well.  After she eats she has discomfort with noted symptoms for up to 4 to 5 hours.  She has only been able to eat lunch and dinner at times.  She has chronic constipation but has not noted any changes in her bowel habits or blood in her stools.  She initially tried to take Pepto-Bismol without much effect.  She had continue taking her PPI twice daily for her esophagitis and GERD symptoms.  She does not feel that her esophageal dysphagia symptoms are an issue after her most recent endoscopy.  She also takes over-the-counter supplements for energy as well as appetite suppression though with the progressive nature of her symptoms she stopped these medications.  She also takes magnesium on a  regular basis which has helped with her bowels in the past.  She has not had any recent use of antibiotics.  She has not had a significant change in her dietary intake prior to the symptoms developing.  When she was seen by her gynecologist within the last 2 weeks she had abnormal LFTs found and underwent an acute hepatitis panel that was negative.  We repeated her LFTs and they were now normalized earlier this week.  She had no evidence of pancreatitis based on her enzymes and she had normal blood counts.  We performed a expedited CT scan with results as below but showed no acute pathology.  GI Review of Systems Positive as above Negative for odynophagia, melena, hematochezia   Review of Systems General: Denies fevers/chills Cardiovascular: Denies chest pain Pulmonary: Denies shortness of breath Gastroenterological: See HPI Genitourinary: Denies darkened urine Hematological: Denies easy bruising/bleeding Dermatological: Denies jaundice Psychological: Mood is anxious about her symptoms   Medications Current Outpatient Medications  Medication Sig Dispense Refill   AMBULATORY NON FORMULARY MEDICATION Viviscal herbal supplement 1 tablet by mouth twice daily     bimatoprost (LATISSE) 0.03 % ophthalmic solution Place 1 drop into both eyes as needed.     cyclobenzaprine (FLEXERIL) 10 MG tablet Take 10 mg by mouth every 8 (eight) hours as needed.     estradiol (ESTRACE) 2 MG tablet Take 2 mg by mouth daily.     omeprazole (PRILOSEC) 40 MG capsule Take 1 capsule (40 mg  total) by mouth daily. 60 capsule 12   ondansetron (ZOFRAN-ODT) 4 MG disintegrating tablet Take 1 tablet (4 mg total) by mouth every 8 (eight) hours as needed for nausea or vomiting. 30 tablet 1   progesterone (PROMETRIUM) 100 MG capsule Take 100 mg by mouth at bedtime.     rosuvastatin (CRESTOR) 10 MG tablet Take 10 mg by mouth daily.     sucralfate (CARAFATE) 1 GM/10ML suspension Take 10 mLs (1 g total) by mouth 4 (four) times  daily -  with meals and at bedtime. 1200 mL 5   No current facility-administered medications for this visit.    Allergies Allergies  Allergen Reactions   Codeine Itching    Histories Past Medical History:  Diagnosis Date   GERD (gastroesophageal reflux disease)    Hypercholesteremia    PONV (postoperative nausea and vomiting)    Past Surgical History:  Procedure Laterality Date   BIOPSY  05/08/2021   Procedure: BIOPSY;  Surgeon: Rush Landmark Telford Nab., MD;  Location: [facility];  Service: Gastroenterology;;   COLONOSCOPY     ECTOPIC PREGNANCY SURGERY     ESOPHAGEAL DILATION     ESOPHAGOGASTRODUODENOSCOPY N/A 05/08/2021   Procedure: ESOPHAGOGASTRODUODENOSCOPY (EGD);  Surgeon: Irving Copas., MD;  Location: [facility];  Service: Gastroenterology;  Laterality: N/A;   FOREIGN BODY REMOVAL  05/08/2021   Procedure: FOREIGN BODY REMOVAL;  Surgeon: Rush Landmark, Telford Nab., MD;  Location: [facility] ENDOSCOPY;  Service: Gastroenterology;;   Social History   Socioeconomic History   Marital status: Married    Spouse name: Not on file   Number of children: Not on file   Years of education: Not on file   Highest education level: Not on file  Occupational History   Not on file  Tobacco Use   Smoking status: Former    Packs/day: 1.00    Years: 15.00    Total pack years: 15.00    Types: Cigarettes    Quit date: 02/12/1996    Years since quitting: 25.5   Smokeless tobacco: Never  Vaping Use   Vaping Use: Never used  Substance and Sexual Activity   Alcohol use: Yes   Drug use: Not Currently   Sexual activity: Not on file  Other Topics Concern   Not on file  Social History Narrative   Not on file   Social Determinants of Health   Financial Resource Strain: Not on file  Food Insecurity: Not on file  Transportation Needs: Not on file  Physical Activity: Not on file  Stress: Not on file  Social Connections: Not on file  Intimate Partner Violence: Not on file   Family  History  Problem Relation Age of Onset   Colon cancer Neg Hx    Esophageal cancer Neg Hx    Inflammatory bowel disease Neg Hx    Liver disease Neg Hx    Pancreatic cancer Neg Hx    Rectal cancer Neg Hx    Stomach cancer Neg Hx    I have reviewed her medical, social, and family history in detail and updated the electronic medical record as necessary.    PHYSICAL EXAMINATION  BP 106/68   Pulse 82   Ht '5\' 7"'$  (1.702 m)   Wt 160 lb (72.6 kg)   SpO2 98%   BMI 25.06 kg/m  Wt Readings from Last 3 Encounters:  08/29/21 160 lb (72.6 kg)  07/10/21 172 lb (78 kg)  06/08/21 172 lb 2 oz (78.1 kg)  GEN: NAD, appears stated age, doesn't  appear chronically ill, documented 10 pound weight loss over the course of 2 months PSYCH: Cooperative, without pressured speech EYE: Conjunctivae pink, sclerae anicteric ENT: MMM CV: Nontachycardic RESP: No audible wheezing GI: NABS, soft, NT/ND, without rebound MSK/EXT: No lower extremity edema SKIN: No jaundice NEURO:  Alert & Oriented x 3, no focal deficits   REVIEW OF DATA  I reviewed the following data at the time of this encounter:  GI Procedures and Studies  May 2023 endoscopy - No gross lesions in esophagus. Z-line regular, 39 cm from the incisors. - Non-obstructing Schatzki ring - disrupted. - Dilation performed in the esophagus. Mucosal wrent noted just distal to UES. No perforation. - 2 cm hiatal hernia. - No gross lesions in the stomach. - No gross lesions in the duodenal bulb, in the first portion of the duodenum and in the second portion of the duodenum.  Pathology Diagnosis Surgical [P], Schatzki ring - ESOPHAGEAL SQUAMOUS AND CARDIAC MUCOSA WITH MILD CHRONIC NONSPECIFIC CARDITIS - NEGATIVE FOR INTESTINAL METAPLASIA OR DYSPLASIA  Laboratory Studies  Reviewed those in epic  Imaging Studies  July 2023 CT abdomen pelvis IMPRESSION: 1. No acute findings within the abdomen or pelvis. 2. Aortic Atherosclerosis  (ICD10-I70.0).  ASSESSMENT  Ms. Jaquith is a 56 y.o. female with a pmh significant for hyperlipidemia, GERD (with prior esophagitis and stricturing-status post dilation), chronic constipation.  The patient is seen today for evaluation and management of:  1. Epigastric pain   2. Dyspepsia   3. Nausea   4. Anorexia   5. Unintentional weight loss   6. Bloating   7. Abnormal LFTs   8. Esophageal dysphagia   9. Chronic constipation    The patient is hemodynamically stable.  Clinically however she is experiencing significant GI symptoms and an unintentional weight loss.  Initially my concern was whether the patient may have or is dealing with some sort of acute hepatitis but her LFTs have normalized on most recent evaluation just a couple of days ago.  She has no evidence of chronic gallbladder pathology based on her CT scan but we need to rule out any evidence of biliary stone disease and so a right upper quadrant ultrasound is recommended.  I think it is reasonable in the setting of her recent upper endoscopy to say that H. pylori is unlikely though she certainly could be at risk of acquiring it at any time point in her life.  We will keep her on her current PPI dosing.  We will initiate her on Carafate 2-3 times per day in an effort of seeing that will help calm these dyspeptic-like symptoms.  FDgard samples were given to the patient to see about potential use in the coming days pending how she does with Carafate.  Patient may be dealing with a dysbiosis and bacterial overgrowth.  Previous use of probiotics for her has not been successful or helpful for other issues so I do not think it is worth considering or retrialing.  The patient will give [place] an update next week in terms of how her symptoms are and will perform breath testing to rule out bacterial overgrowth.  All patient questions were answered to the best of my ability, and the patient agrees to the aforementioned plan of action with follow-up as  indicated.   PLAN  Continue PPI twice daily Reinitiate Carafate 2-3 times daily Right upper quadrant ultrasound to be performed to evaluate for gallbladder gallstones If patient is still having issues into next week we will plan  to repeat a CMP/amylase/lipase SIBO breath testing to be performed FDgard given to patient if she is still having symptoms into next week to trial   Orders Placed This Encounter  Procedures   US Abdomen Limited RUQ (LIVER/GB)    New Prescriptions   No medications on file   Modified Medications   Modified Medication Previous Medication   SUCRALFATE (CARAFATE) 1 GM/10ML SUSPENSION sucralfate (CARAFATE) 1 GM/10ML suspension      Take 10 mLs (1 g total) by mouth 4 (four) times daily -  with meals and at bedtime.    Take 10 mLs (1 g total) by mouth 2 (two) times daily.    Planned Follow Up No follow-ups on file.   Total Time in Face-to-Face and in Coordination of Care for patient including independent/personal interpretation/review of prior testing, medical history, examination, medication adjustment, communicating results with the patient directly, and documentation within the EHR is 25 minutes.   Justice Britain, MD [facility] Office # [phone]

## 2021-08-31 ENCOUNTER — Ambulatory Visit (HOSPITAL_COMMUNITY)
Admission: RE | Admit: 2021-08-31 | Discharge: 2021-08-31 | Disposition: A | Discharge status: Home / Self Care | Payer: 59 | Source: Ambulatory Visit | Attending: Gastroenterology | Admitting: Gastroenterology

## 2021-08-31 DIAGNOSIS — R7989 Other specified abnormal findings of blood chemistry: Secondary | ICD-10-CM | POA: Insufficient documentation

## 2021-08-31 DIAGNOSIS — R1013 Epigastric pain: Secondary | ICD-10-CM | POA: Insufficient documentation

## 2021-08-31 DIAGNOSIS — R110 Nausea: Secondary | ICD-10-CM | POA: Diagnosis present

## 2021-10-03 ENCOUNTER — Emergency Department (HOSPITAL_COMMUNITY)
Admission: EM | Admit: 2021-10-03 | Discharge: 2021-10-03 | Disposition: A | Discharge status: Home / Self Care | Payer: 59 | Attending: Emergency Medicine | Admitting: Emergency Medicine

## 2021-10-03 ENCOUNTER — Encounter (HOSPITAL_COMMUNITY): Payer: Self-pay | Admitting: Emergency Medicine

## 2021-10-03 ENCOUNTER — Emergency Department (HOSPITAL_COMMUNITY): Payer: 59

## 2021-10-03 DIAGNOSIS — R718 Other abnormality of red blood cells: Secondary | ICD-10-CM | POA: Insufficient documentation

## 2021-10-03 DIAGNOSIS — R1031 Right lower quadrant pain: Secondary | ICD-10-CM | POA: Diagnosis present

## 2021-10-03 DIAGNOSIS — N131 Hydronephrosis with ureteral stricture, not elsewhere classified: Secondary | ICD-10-CM | POA: Diagnosis not present

## 2021-10-03 DIAGNOSIS — N201 Calculus of ureter: Secondary | ICD-10-CM

## 2021-10-03 LAB — COMPREHENSIVE METABOLIC PANEL
ALT: 35 U/L (ref 0–44)
AST: 27 U/L (ref 15–41)
Albumin: 4 g/dL (ref 3.5–5.0)
Alkaline Phosphatase: 81 U/L (ref 38–126)
Anion gap: 8 (ref 5–15)
BUN: 22 mg/dL — ABNORMAL HIGH (ref 6–20)
CO2: 22 mmol/L (ref 22–32)
Calcium: 11.1 mg/dL — ABNORMAL HIGH (ref 8.9–10.3)
Chloride: 111 mmol/L (ref 98–111)
Creatinine, Ser: 1.01 mg/dL — ABNORMAL HIGH (ref 0.44–1.00)
GFR, Estimated: >60 mL/min (ref >60)
Glucose, Bld: 114 mg/dL — ABNORMAL HIGH (ref 70–99)
Potassium: 3.9 mmol/L (ref 3.5–5.1)
Sodium: 141 mmol/L (ref 135–145)
Total Bilirubin: 0.5 mg/dL (ref 0.3–1.2)
Total Protein: 6.6 g/dL (ref 6.5–8.1)

## 2021-10-03 LAB — CBC
HCT: 47.7 % — ABNORMAL HIGH (ref 36.0–46.0)
Hemoglobin: 15.4 g/dL — ABNORMAL HIGH (ref 12.0–15.0)
MCH: 31.5 pg (ref 26.0–34.0)
MCHC: 32.3 g/dL (ref 30.0–36.0)
MCV: 97.5 fL (ref 80.0–100.0)
Platelets: 207 10*3/uL (ref 150–400)
RBC: 4.89 MIL/uL (ref 3.87–5.11)
RDW: 13 % (ref 11.5–15.5)
WBC: 9.2 10*3/uL (ref 4.0–10.5)
nRBC: 0 % (ref 0.0–0.2)

## 2021-10-03 LAB — URINALYSIS, ROUTINE W REFLEX MICROSCOPIC
Bilirubin Urine: NEGATIVE
Glucose, UA: NEGATIVE mg/dL
Hgb urine dipstick: NEGATIVE
Ketones, ur: 20 mg/dL — AB
Leukocytes,Ua: NEGATIVE
Nitrite: NEGATIVE
Protein, ur: NEGATIVE mg/dL
Specific Gravity, Urine: 1.04 — ABNORMAL HIGH (ref 1.005–1.030)
pH: 7 (ref 5.0–8.0)

## 2021-10-03 LAB — I-STAT BETA HCG BLOOD, ED (MC, WL, AP ONLY): I-stat hCG, quantitative: <5 m[IU]/mL (ref <5)

## 2021-10-03 LAB — LIPASE, BLOOD: Lipase: 33 U/L (ref 11–51)

## 2021-10-03 MED ORDER — KETOROLAC TROMETHAMINE 15 MG/ML IJ SOLN
15.0000 mg | Freq: Once | INTRAMUSCULAR | Status: AC
  Administered 2021-10-03: 15 mg via INTRAVENOUS
  Filled 2021-10-03: qty 1

## 2021-10-03 MED ORDER — ONDANSETRON 4 MG PO TBDP: 4.0000 mg | ORAL_TABLET | Freq: Three times a day (TID) | ORAL | 0 refills | Status: AC | PRN

## 2021-10-03 MED ORDER — IOHEXOL 300 MG/ML  SOLN
100.0000 mL | Freq: Once | INTRAMUSCULAR | Status: AC | PRN
  Administered 2021-10-03: 100 mL via INTRAVENOUS

## 2021-10-03 MED ORDER — HYDROCODONE-ACETAMINOPHEN 5-325 MG PO TABS: 1.0000 | ORAL_TABLET | Freq: Once | ORAL | Status: DC

## 2021-10-03 MED ORDER — ONDANSETRON 4 MG PO TBDP
4.0000 mg | ORAL_TABLET | Freq: Once | ORAL | Status: AC | PRN
  Administered 2021-10-03: 4 mg via ORAL
  Filled 2021-10-03: qty 1

## 2021-10-03 MED ORDER — HYDROMORPHONE HCL 1 MG/ML IJ SOLN
0.5000 mg | Freq: Once | INTRAMUSCULAR | Status: AC
  Administered 2021-10-03: 0.5 mg via INTRAVENOUS
  Filled 2021-10-03: qty 1

## 2021-10-03 MED ORDER — OXYCODONE-ACETAMINOPHEN 5-325 MG PO TABS: 1.0000 | ORAL_TABLET | Freq: Three times a day (TID) | ORAL | 0 refills | Status: DC | PRN

## 2021-10-03 MED ORDER — ONDANSETRON HCL 4 MG/2ML IJ SOLN
4.0000 mg | Freq: Once | INTRAMUSCULAR | Status: AC
  Administered 2021-10-03: 4 mg via INTRAVENOUS
  Filled 2021-10-03: qty 2

## 2021-10-03 MED ORDER — OXYCODONE-ACETAMINOPHEN 5-325 MG PO TABS
1.0000 | ORAL_TABLET | Freq: Once | ORAL | Status: AC
  Administered 2021-10-03: 1 via ORAL
  Filled 2021-10-03: qty 1

## 2021-10-03 NOTE — ED Notes (Signed)
Pt given urine strainer

## 2021-10-03 NOTE — ED Triage Notes (Signed)
Patient BIB [facility] with compliant of RLQ abdominal pain radiating into back that started this morning. Also reports nausea and vomiting, history of gallstones. Patient is alert, oriented, and in no apparent distress at this time.  EMS Vitals 120/90 HR100 RR20 CBG 129

## 2021-10-03 NOTE — ED Provider Notes (Signed)
[facility] EMERGENCY DEPARTMENT Provider Note   CSN: [id number] Arrival date & time: 10/03/21  5573     History  Chief Complaint  Patient presents with   Abdominal Pain    Barbara Jimenez is a 56 y.o. female.   Abdominal Pain Patient with acute abdominal pain.  Began acutely around 630 this morning.  Severe.  Went to the gym this morning and did fine but then developed the pain.  Nausea and vomiting.  Feels a little better after vomiting.  Has not had pain since before.  No vaginal bleeding or discharge.  No dysuria.  No kidney stone history.  Has had some known gallstones.  Points to right mid to lower abdomen as where the pain is.  No fevers.  Still having bowel movements.    Past Medical History:  Diagnosis Date   GERD (gastroesophageal reflux disease)    Hypercholesteremia    PONV (postoperative nausea and vomiting)     Home Medications Prior to Admission medications   Medication Sig Start Date End Date Taking? Authorizing Provider  ondansetron (ZOFRAN-ODT) 4 MG disintegrating tablet Take 1 tablet (4 mg total) by mouth every 8 (eight) hours as needed for nausea or vomiting. 10/03/21  Yes Davonna Belling, MD  oxyCODONE-acetaminophen (PERCOCET/ROXICET) 5-325 MG tablet Take 1-2 tablets by mouth every 8 (eight) hours as needed for severe pain. 10/03/21  Yes Davonna Belling, MD  AMBULATORY NON FORMULARY MEDICATION Viviscal herbal supplement 1 tablet by mouth twice daily    [provider]  bimatoprost (LATISSE) 0.03 % ophthalmic solution Place 1 drop into both eyes as needed. 11/20/20   [provider]  cyclobenzaprine (FLEXERIL) 10 MG tablet Take 10 mg by mouth every 8 (eight) hours as needed. 03/09/21   [provider]  estradiol (ESTRACE) 2 MG tablet Take 2 mg by mouth daily. 05/05/21   [provider]  omeprazole (PRILOSEC) 40 MG capsule Take 1 capsule (40 mg total) by mouth daily. 05/08/21   Mansouraty, Telford Nab., MD   progesterone (PROMETRIUM) 100 MG capsule Take 100 mg by mouth at bedtime. 02/27/21   [provider]  rosuvastatin (CRESTOR) 10 MG tablet Take 10 mg by mouth daily. 05/04/21   [provider]  sucralfate (CARAFATE) 1 GM/10ML suspension Take 10 mLs (1 g total) by mouth 4 (four) times daily -  with meals and at bedtime. 08/29/21 08/29/22  Mansouraty, Telford Nab., MD      Allergies    Codeine    Review of Systems   Review of Systems  Gastrointestinal:  Positive for abdominal pain.    Physical Exam Updated Vital Signs BP (!) 132/59   Pulse 82   Temp 97.6 F (36.4 C)   Resp 18   SpO2 97%  Physical Exam Vitals and nursing note reviewed.  HENT:     Head: Normocephalic.  Cardiovascular:     Rate and Rhythm: Regular rhythm.  Pulmonary:     Effort: Pulmonary effort is normal.  Abdominal:     Tenderness: There is abdominal tenderness.     Comments: Lying in bed on her side in fetal position.  Tenderness to right mid abdomen.  No hernia palpated.  No CVA tenderness.  Skin:    General: Skin is warm.  Neurological:     Mental Status: She is alert.     ED Results / Procedures / Treatments   Labs (all labs ordered are listed, but only abnormal results are displayed) Labs Reviewed  COMPREHENSIVE  METABOLIC PANEL - Abnormal; Notable for the following components:      Result Value   Glucose, Bld 114 (*)    BUN 22 (*)    Creatinine, Ser 1.01 (*)    Calcium 11.1 (*)    All other components within normal limits  CBC - Abnormal; Notable for the following components:   Hemoglobin 15.4 (*)    HCT 47.7 (*)    All other components within normal limits  URINALYSIS, ROUTINE W REFLEX MICROSCOPIC - Abnormal; Notable for the following components:   APPearance HAZY (*)    Specific Gravity, Urine 1.040 (*)    Ketones, ur 20 (*)    All other components within normal limits  LIPASE, BLOOD  I-STAT BETA HCG BLOOD, ED ([facility], [facility], [facility] ONLY)    EKG None  Radiology CT Abdomen  Pelvis W Contrast  Result Date: 10/03/2021 CLINICAL DATA:  RIGHT lower quadrant abdominal pain EXAM: CT ABDOMEN AND PELVIS WITH CONTRAST TECHNIQUE: Multidetector CT imaging of the abdomen and pelvis was performed using the standard protocol following bolus administration of intravenous contrast. RADIATION DOSE REDUCTION: This exam was performed according to the departmental dose-optimization program which includes automated exposure control, adjustment of the mA and/or kV according to patient size and/or use of iterative reconstruction technique. CONTRAST:  147m OMNIPAQUE IOHEXOL 300 MG/ML SOLN IV. No oral contrast. COMPARISON:  08/28/2021 FINDINGS: Lower chest: Dependent atelectasis at lung bases Hepatobiliary: Gallbladder and liver normal appearance Pancreas: Normal appearance Spleen: Normal appearance Adrenals/Urinary Tract: Adrenal glands, LEFT kidney, and LEFT ureter normal appearance. Moderate RIGHT hydronephrosis and hydroureter with associated delay in RIGHT nephrogram. 3 mm distal RIGHT ureteral calculus adjacent to ureterovesical junction. No renal masses. Stomach/Bowel: Normal appendix. Stomach and bowel loops normal appearance. Vascular/Lymphatic: Vascular structures patent. Atherosclerotic calcifications aorta and iliac arteries without aneurysm. Scattered pelvic phleboliths. No adenopathy. Reproductive: Unremarkable uterus and adnexa Other: No free air or free fluid. No hernia or additional inflammatory process. Musculoskeletal: Unremarkable IMPRESSION: Moderate RIGHT hydronephrosis and hydroureter with associated delay in RIGHT nephrogram secondary to a 3 mm distal RIGHT ureteral calculus adjacent to ureterovesical junction. Aortic Atherosclerosis (ICD10-I70.0). Electronically Signed   By: MLavonia DanaM.D.   On: 10/03/2021 11:45    Procedures Procedures    Medications Ordered in ED Medications  ondansetron (ZOFRAN-ODT) disintegrating tablet 4 mg (4 mg Oral Given 10/03/21 0833)   HYDROmorphone (DILAUDID) injection 0.5 mg (0.5 mg Intravenous Given 10/03/21 1039)  ketorolac (TORADOL) 15 MG/ML injection 15 mg (15 mg Intravenous Given 10/03/21 1142)  ondansetron (ZOFRAN) injection 4 mg (4 mg Intravenous Given 10/03/21 1142)  HYDROmorphone (DILAUDID) injection 0.5 mg (0.5 mg Intravenous Given 10/03/21 1142)  iohexol (OMNIPAQUE) 300 MG/ML solution 100 mL (100 mLs Intravenous Contrast Given 10/03/21 1139)  oxyCODONE-acetaminophen (PERCOCET/ROXICET) 5-325 MG per tablet 1 tablet (1 tablet Oral Given 10/03/21 1250)    ED Course/ Medical Decision Making/ A&P                           Medical Decision Making Amount and/or Complexity of Data Reviewed Labs: ordered.  Risk Prescription drug management.   Patient with severe abdominal pain.  Began acutely.  Somewhat crampy.  Reviewed previous CT scan and no stones at that time as of about a month ago.  However with the acute beginning makes more likely something obstructive such as biliary disease ovarian torsion/kidney stone.  Will get CT scan.  Lab work reassuring.  Hemoglobin is mildly elevated but  appears to be at her baseline.  Will treat symptomatically for pain.  Feels better after treatment with Toradol.  CT scan done and independently interpreted and shows distal right 4 mm obstructing ureteral stone.  I think this likely the cause of the pain.  Urine does not show infection.  We will treat symptomatically at home with pain meds and antiemetics.  Strain urine and have follow-up with urology.  Follow-up instructions given.  Will discharge home.        Final Clinical Impression(s) / ED Diagnoses Final diagnoses:  Right ureteral stone    Rx / DC Orders ED Discharge Orders          Ordered    oxyCODONE-acetaminophen (PERCOCET/ROXICET) 5-325 MG tablet  Every 8 hours PRN        10/03/21 1238    ondansetron (ZOFRAN-ODT) 4 MG disintegrating tablet  Every 8 hours PRN        10/03/21 1238              Davonna Belling, MD 10/03/21 1251

## 2021-10-03 NOTE — ED Provider Triage Note (Signed)
Emergency Medicine Provider Triage Evaluation Note  Barbara Jimenez , a 56 y.o. female  was evaluated in triage.  Pt complains of right lower quadrant abdominal pain.  Patient states symptoms began insidiously when she was getting ready for work around 630 this morning.  She has had associated nausea and vomiting has been nonbloody in appearance.  She has persistence of symptoms since onset.  Denies prior abdominal surgeries.  Denies urinary/vaginal symptoms, fever, chills, night sweats, chest pain, shortness of breath.  Denies history of kidney stones.  Review of Systems  Positive: See above Negative:   Physical Exam  BP (!) 160/76 (BP Location: Right Arm)   Pulse 99   Temp 97.6 F (36.4 C)   Resp 16   SpO2 100%  Gen:   Awake, no distress   Resp:  Normal effort  MSK:   Moves extremities without difficulty  Other:  Right lower quadrant tenderness on exam.  No CVA tenderness bilaterally.  Medical Decision Making  Medically screening exam initiated at 9:11 AM.  Appropriate orders placed.  MALAYJAH OTOOLE was informed that the remainder of the evaluation will be completed by another provider, this initial triage assessment does not replace that evaluation, and the importance of remaining in the ED until their evaluation is complete.     Wilnette Kales, [place] 10/03/21 [phone]

## 2021-10-04 ENCOUNTER — Emergency Department (HOSPITAL_COMMUNITY): Payer: 59

## 2021-10-04 ENCOUNTER — Ambulatory Visit (HOSPITAL_COMMUNITY)
Admission: EM | Admit: 2021-10-04 | Discharge: 2021-10-04 | Disposition: A | Discharge status: Home / Self Care | Payer: 59 | Attending: Emergency Medicine | Admitting: Emergency Medicine

## 2021-10-04 ENCOUNTER — Emergency Department (HOSPITAL_BASED_OUTPATIENT_CLINIC_OR_DEPARTMENT_OTHER): Payer: 59

## 2021-10-04 ENCOUNTER — Other Ambulatory Visit: Payer: Self-pay

## 2021-10-04 ENCOUNTER — Encounter (HOSPITAL_COMMUNITY)
Admission: EM | Disposition: A | Discharge status: Home / Self Care | Payer: Self-pay | Source: Home / Self Care | Attending: Emergency Medicine

## 2021-10-04 ENCOUNTER — Encounter (HOSPITAL_COMMUNITY): Payer: Self-pay | Admitting: Emergency Medicine

## 2021-10-04 DIAGNOSIS — Z87891 Personal history of nicotine dependence: Secondary | ICD-10-CM | POA: Insufficient documentation

## 2021-10-04 DIAGNOSIS — N132 Hydronephrosis with renal and ureteral calculous obstruction: Secondary | ICD-10-CM | POA: Insufficient documentation

## 2021-10-04 DIAGNOSIS — N201 Calculus of ureter: Secondary | ICD-10-CM | POA: Diagnosis not present

## 2021-10-04 HISTORY — PX: CYSTOSCOPY/URETEROSCOPY/HOLMIUM LASER/STENT PLACEMENT: SHX6546

## 2021-10-04 HISTORY — DX: Personal history of urinary calculi: Z87.442

## 2021-10-04 SURGERY — CYSTOSCOPY/URETEROSCOPY/HOLMIUM LASER/STENT PLACEMENT
Anesthesia: General | Site: Ureter | Laterality: Right

## 2021-10-04 MED ORDER — MIDAZOLAM HCL 2 MG/2ML IJ SOLN
INTRAMUSCULAR | Status: AC
Start: 2021-10-04 — End: ?
  Filled 2021-10-04: qty 2

## 2021-10-04 MED ORDER — OXYCODONE-ACETAMINOPHEN 5-325 MG PO TABS: 1.0000 | ORAL_TABLET | Freq: Three times a day (TID) | ORAL | 0 refills | Status: AC | PRN

## 2021-10-04 MED ORDER — CEPHALEXIN 500 MG PO CAPS: 500.0000 mg | ORAL_CAPSULE | Freq: Two times a day (BID) | ORAL | 0 refills | Status: AC

## 2021-10-04 MED ORDER — FENTANYL CITRATE (PF) 100 MCG/2ML IJ SOLN
INTRAMUSCULAR | Status: AC
  Filled 2021-10-04: qty 2

## 2021-10-04 MED ORDER — MIDAZOLAM HCL 5 MG/5ML IJ SOLN
INTRAMUSCULAR | Status: DC | PRN
  Administered 2021-10-04: 2 mg via INTRAVENOUS

## 2021-10-04 MED ORDER — KETOROLAC TROMETHAMINE 15 MG/ML IJ SOLN
15.0000 mg | Freq: Once | INTRAMUSCULAR | Status: AC
  Administered 2021-10-04: 15 mg via INTRAVENOUS
  Filled 2021-10-04: qty 1

## 2021-10-04 MED ORDER — DOCUSATE SODIUM 100 MG PO CAPS
100.0000 mg | ORAL_CAPSULE | Freq: Every day | ORAL | 0 refills | Status: AC | PRN
Start: 2021-10-04 — End: ?

## 2021-10-04 MED ORDER — ACETAMINOPHEN 10 MG/ML IV SOLN
INTRAVENOUS | Status: DC | PRN
  Administered 2021-10-04: 1000 mg via INTRAVENOUS

## 2021-10-04 MED ORDER — IOHEXOL 300 MG/ML  SOLN
INTRAMUSCULAR | Status: DC | PRN
  Administered 2021-10-04: 3 mL via URETHRAL

## 2021-10-04 MED ORDER — EPHEDRINE SULFATE-NACL 50-0.9 MG/10ML-% IV SOSY
PREFILLED_SYRINGE | INTRAVENOUS | Status: DC | PRN
  Administered 2021-10-04: 5 mg via INTRAVENOUS

## 2021-10-04 MED ORDER — GLYCOPYRROLATE 0.2 MG/ML IJ SOLN
INTRAMUSCULAR | Status: DC | PRN
  Administered 2021-10-04: .1 mg via INTRAVENOUS

## 2021-10-04 MED ORDER — LACTATED RINGERS IV SOLN: INTRAVENOUS | Status: DC

## 2021-10-04 MED ORDER — CHLORHEXIDINE GLUCONATE 0.12 % MT SOLN
15.0000 mL | Freq: Once | OROMUCOSAL | Status: AC
  Administered 2021-10-04: 15 mL via OROMUCOSAL

## 2021-10-04 MED ORDER — PROPOFOL 10 MG/ML IV BOLUS
INTRAVENOUS | Status: DC | PRN
  Administered 2021-10-04: 180 mg via INTRAVENOUS
  Administered 2021-10-04: 30 mg via INTRAVENOUS
  Administered 2021-10-04: 200 ug/kg/min via INTRAVENOUS

## 2021-10-04 MED ORDER — FENTANYL CITRATE (PF) 100 MCG/2ML IJ SOLN
INTRAMUSCULAR | Status: DC | PRN
  Administered 2021-10-04 (×4): 25 ug via INTRAVENOUS

## 2021-10-04 MED ORDER — CEFAZOLIN SODIUM-DEXTROSE 2-3 GM-%(50ML) IV SOLR
INTRAVENOUS | Status: DC | PRN
  Administered 2021-10-04: 2 g via INTRAVENOUS

## 2021-10-04 MED ORDER — ACETAMINOPHEN 10 MG/ML IV SOLN
INTRAVENOUS | Status: AC
  Filled 2021-10-04: qty 100

## 2021-10-04 MED ORDER — PHENYLEPHRINE HCL (PRESSORS) 10 MG/ML IV SOLN
INTRAVENOUS | Status: AC
  Filled 2021-10-04: qty 1

## 2021-10-04 MED ORDER — KETOROLAC TROMETHAMINE 15 MG/ML IJ SOLN
INTRAMUSCULAR | Status: DC | PRN
  Administered 2021-10-04: 15 mg via INTRAVENOUS

## 2021-10-04 MED ORDER — LIDOCAINE 2% (20 MG/ML) 5 ML SYRINGE
INTRAMUSCULAR | Status: DC | PRN
  Administered 2021-10-04: 60 mg via INTRAVENOUS

## 2021-10-04 MED ORDER — SODIUM CHLORIDE 0.9 % IR SOLN
Status: DC | PRN
  Administered 2021-10-04: 3000 mL

## 2021-10-04 MED ORDER — DEXAMETHASONE SODIUM PHOSPHATE 10 MG/ML IJ SOLN
INTRAMUSCULAR | Status: DC | PRN
  Administered 2021-10-04: 10 mg via INTRAVENOUS

## 2021-10-04 MED ORDER — DEXAMETHASONE SODIUM PHOSPHATE 10 MG/ML IJ SOLN
INTRAMUSCULAR | Status: AC
Start: 2021-10-04 — End: ?
  Filled 2021-10-04: qty 1

## 2021-10-04 MED ORDER — PROPOFOL 500 MG/50ML IV EMUL: INTRAVENOUS | Status: DC | PRN

## 2021-10-04 MED ORDER — ONDANSETRON HCL 4 MG/2ML IJ SOLN
INTRAMUSCULAR | Status: DC | PRN
  Administered 2021-10-04: 4 mg via INTRAVENOUS

## 2021-10-04 MED ORDER — ONDANSETRON HCL 4 MG/2ML IJ SOLN
4.0000 mg | Freq: Once | INTRAMUSCULAR | Status: AC
  Administered 2021-10-04: 4 mg via INTRAVENOUS
  Filled 2021-10-04: qty 2

## 2021-10-04 MED ORDER — ONDANSETRON HCL 4 MG/2ML IJ SOLN
INTRAMUSCULAR | Status: AC
  Filled 2021-10-04: qty 2

## 2021-10-04 SURGICAL SUPPLY — 22 items
APL SKNCLS STERI-STRIP NONHPOA (GAUZE/BANDAGES/DRESSINGS)
BAG URO CATCHER STRL LF (MISCELLANEOUS) ×2 IMPLANT
BASKET ZERO TIP NITINOL 2.4FR (BASKET) IMPLANT
BENZOIN TINCTURE PRP APPL 2/3 (GAUZE/BANDAGES/DRESSINGS) IMPLANT
BSKT STON RTRVL ZERO TP 2.4FR (BASKET) ×1
CATH URETL OPEN 5X70 (CATHETERS) ×2 IMPLANT
CLOTH BEACON ORANGE TIMEOUT ST (SAFETY) ×2 IMPLANT
DRSG TEGADERM 2-3/8X2-3/4 SM (GAUZE/BANDAGES/DRESSINGS) IMPLANT
GLOVE BIOGEL M 7.0 STRL (GLOVE) ×2 IMPLANT
GOWN STRL REUS W/ TWL XL LVL3 (GOWN DISPOSABLE) ×2 IMPLANT
GOWN STRL REUS W/TWL XL LVL3 (GOWN DISPOSABLE) ×1
GUIDEWIRE STR DUAL SENSOR (WIRE) ×4 IMPLANT
GUIDEWIRE ZIPWRE .038 STRAIGHT (WIRE) IMPLANT
KIT TURNOVER KIT A (KITS) IMPLANT
LASER FIB FLEXIVA PULSE ID 365 (Laser) IMPLANT
MANIFOLD NEPTUNE II (INSTRUMENTS) ×2 IMPLANT
PACK CYSTO (CUSTOM PROCEDURE TRAY) ×2 IMPLANT
SHEATH NAV HD 11/13X46 (SHEATH) IMPLANT
TRACTIP FLEXIVA PULS ID 200XHI (Laser) IMPLANT
TRACTIP FLEXIVA PULSE ID 200 (Laser) ×1
TUBING CONNECTING 10 (TUBING) ×2 IMPLANT
TUBING UROLOGY SET (TUBING) ×2 IMPLANT

## 2021-10-04 NOTE — Op Note (Signed)
Operative Note  Preoperative diagnosis:  1.  Right ureteral stone  Postoperative diagnosis: 1.  Right ureteral stone  Procedure(s): 1.  Cystoscopy 2.  Right ureteroscopy with laser lithotripsy and basket extraction of stones 3.  Right retrograde pyelogram 4.  Right ureteral stent placement 5. Fluoroscopy with intraoperative interpretation  Surgeon: Rexene Alberts, MD  Assistants:  None  Anesthesia:  General  Complications:  None  EBL:  Minimal  Specimens: 1. Stones for stone analysis (to be done at [facility])  Drains/Catheters: 1.  Right 6Fr x 26cm ureteral stent with a tether string  Intraoperative findings:   Cystoscopy demonstrated no suspicious lesions Right ureteroscopy demonstrated a 4 mm distal right ureteral stone just proximal to the intramural portion of the ureter which was slightly stenotic.  The stone was fragmented and basket extracted in its entirety. Right retrograde pyelogram demonstrated moderate right hydronephrosis. Successful right ureteral stent placement.  Indication:  Barbara Jimenez is a 56 y.o. female with a distal right ureteral stone with refractory pain and no evidence of infection.  Description of procedure: After informed consent was obtained from the patient, the patient was identified and taken to the operating room and placed in the supine position.  General anesthesia was administered as well as perioperative IV antibiotics.  At the beginning of the case, a time-out was performed to properly identify the patient, the surgery to be performed, and the surgical site.  Sequential compression devices were applied to the lower extremities at the beginning of the case for DVT prophylaxis.  The patient was then placed in the dorsal lithotomy supine position, prepped and draped in sterile fashion.  Preliminary scout fluoroscopy revealed that there was a 4 mm calcification area at the right distal ureter , which corresponds to the  stone found on  the preoperative CT scan. We then passed the 21-French rigid cystoscope through the urethra and into the bladder under vision without any difficulty.  A systematic evaluation of the bladder revealed no evidence of any suspicious bladder lesions.  Ureteral orifices were in normal position.    Under cystoscopic and flouroscopic guidance, we cannulated the right ureteral orifice with a 5-French open-ended ureteral catheter and a gentle retrograde pyelogram was performed, revealing moderate right hydronephrosis. A 0.038 sensor wire was then passed up to the level of the renal pelvis and secured to the drape as a safety wire. The ureteral catheter and cystoscope were removed, leaving the safety wire in place.  I then passed a separate 0.038 sensor wire leaving 2 safety wires in place.  A semi-rigid ureteroscope was passed alongside the wires in a train track method up the distal ureter.  The area was slightly stenotic at the intramural portion of her ureter.  I encountered a 4 mm stone just proximal to this.  Using a 200 m holmium laser fiber, the stone was fragmented into several small pieces and these were basketed atraumatically.  I then navigated the scope into the proximal right ureter noting no other stone fragments.  I removed the semirigid ureteroscope.  I then exchanged for a cystoscope.  I repeated right retrograde pyelogram demonstrating moderate right hydronephrosis.  Her ureter measured 25 cm.  Once the ureteroscope was removed, the Glidewire was backloaded through the rigid cystoscope, which was then advanced down the urethra and into the bladder. We then used the Glidewire under direct vision through the rigid cystoscope and under fluoroscopic guidance and passed up a 6-French, 26 cm double-pigtail ureteral stent up ureter, making  sure that the proximal and distal ends coiled within the kidney and bladder respectively.  Note that we left a  long tether string attached to the distal end of the  ureteral stent and it exited the urethral meatus and was secured to theinner thigh with a tegaderm adhesive.  The cystoscope was then advanced back into the bladder under vision.  We were able to see the distal stent coiling nicely within the bladder.  The bladder was then emptied with irrigation solution.  The cystoscope was then removed.    The patient tolerated the procedure well and there was no complication. Patient was awoken from anesthesia and taken to the recovery room in stable condition. I was present and scrubbed for the entirety of the case.  Plan:  Patient will be discharged home.  She will remove her stent in 4 days on Monday morning by pulling on attached string.  I will have her follow with me in the office in 1 month to review renal ultrasound.  Matt R. [facility]  Pager: [phone]

## 2021-10-04 NOTE — ED Triage Notes (Signed)
Pt arrives POV w/ c/o flank pain. Known kidney stone. N/V as well.

## 2021-10-04 NOTE — Transfer of Care (Signed)
Immediate Anesthesia Transfer of Care Note  Patient: Barbara Jimenez  Procedure(s) Performed: CYSTOSCOPY/URETEROSCOPY/HOLMIUM LASER/STENT PLACEMENT (Right: Ureter)  Patient Location: PACU  Anesthesia Type:General  Level of Consciousness: awake, alert , oriented and patient cooperative  Airway & Oxygen Therapy: Patient Spontanous Breathing  Post-op Assessment: Report given to RN and Post -op Vital signs reviewed and stable  Post vital signs: Reviewed and stable  Last Vitals:  Vitals Value Taken Time  BP 104/49 10/04/21 2111  Temp    Pulse 93 10/04/21 2111  Resp 17 10/04/21 2111  SpO2 97 % 10/04/21 2111    Last Pain:  Vitals:   10/04/21 1639  TempSrc: Oral  PainSc: 0-No pain         Complications: No notable events documented.

## 2021-10-04 NOTE — Discharge Instructions (Signed)
[facility] [phone] Post Ureteroscopy With or Without Stent Instructions  Definitions:  Ureter: The duct that transports urine from the kidney to the bladder. Stent:   A plastic hollow tube that is placed into the ureter, from the kidney to the bladder to prevent the ureter from swelling shut.  GENERAL INSTRUCTIONS:  Despite the fact that no skin incisions were used, the area around the ureter and bladder is raw and irritated. The stent is a foreign body which will further irritate the bladder wall. This irritation is manifested by increased frequency of urination, both day and night, and by an increase in the urge to urinate. In some, the urge to urinate is present almost always. Sometimes the urge is strong enough that you may not be able to stop yourself from urinating. The only real cure is to remove the stent and then give time for the bladder wall to heal which can't be done until the danger of the ureter swelling shut has passed, which varies.  You may see some blood in your urine while the stent is in place and a few days afterwards. Do not be alarmed, even if the urine was clear for a while. Get off your feet and drink lots of fluids until clearing occurs. If you start to pass clots or don't improve, call us.  DIET: You may return to your normal diet immediately. Because of the raw surface of your bladder, alcohol, spicy foods, acid type foods and drinks with caffeine may cause irritation or frequency and should be used in moderation. To keep your urine flowing freely and to avoid constipation, drink plenty of fluids during the day ( 8-10 glasses ). Tip: Avoid cranberry juice because it is very acidic.  ACTIVITY: Your physical activity doesn't need to be restricted. However, if you are very active, you may see some blood in your urine. We suggest that you reduce your activity under these circumstances until the bleeding has stopped.  BOWELS: It is important to  keep your bowels regular during the postoperative period. Straining with bowel movements can cause bleeding. A bowel movement every other day is reasonable. Use a mild laxative if needed, such as Milk of Magnesia 2-3 tablespoons, or 2 Dulcolax tablets. Call if you continue to have problems. If you have been taking narcotics for pain, before, during or after your surgery, you may be constipated. Take a laxative if necessary.   MEDICATION: You should resume your pre-surgery medications unless told not to. In addition you will often be given an antibiotic to prevent infection. These should be taken as prescribed until the bottles are finished unless you are having an unusual reaction to one of the drugs.  PROBLEMS YOU SHOULD REPORT TO [place]: Fevers over 100.5 Fahrenheit. Heavy bleeding, or clots ( See above notes about blood in urine ). Inability to urinate. Drug reactions ( hives, rash, nausea, vomiting, diarrhea ). Severe burning or pain with urination that is not improving.  FOLLOW-UP: You will need a follow-up appointment to monitor your progress. Call for this appointment at the number listed above. Usually the first appointment will be about three to fourteen days after your surgery.  You have a stent draining your right kidney.  This may be removed by pulling on attached string on Monday morning.  He will follow with me in 1 month to obtain renal ultrasound to ensure no further hydronephrosis and adequately draining kidney.  The office will call with an appointment.

## 2021-10-04 NOTE — ED Provider Notes (Signed)
[facility] DEPT Provider Note   CSN: [id number] Arrival date & time: 10/04/21  1053     History  Chief Complaint  Patient presents with   Flank Pain    Barbara Jimenez is a 56 y.o. female.   Flank Pain  Patient presents with right lower/flank pain.  History of kidney stone.  Actually seen by me yesterday and diagnosed with a 4 mm distal stone without infection.  Had been doing well after treatment yesterday but today developed uncontrolled pain and nausea vomiting.  States she always has had vomiting with opiates and has been unable to keep the medicine down.  States she tried since the pain got so severe but was still vomiting.  No dysuria.  No fevers.  Pain is in the same location was yesterday.    Past Medical History:  Diagnosis Date   GERD (gastroesophageal reflux disease)    Hypercholesteremia    PONV (postoperative nausea and vomiting)     Home Medications Prior to Admission medications   Medication Sig Start Date End Date Taking? Authorizing Provider  AMBULATORY NON FORMULARY MEDICATION Viviscal herbal supplement 1 tablet by mouth twice daily    [provider]  bimatoprost (LATISSE) 0.03 % ophthalmic solution Place 1 drop into both eyes as needed. 11/20/20   [provider]  cyclobenzaprine (FLEXERIL) 10 MG tablet Take 10 mg by mouth every 8 (eight) hours as needed. 03/09/21   [provider]  estradiol (ESTRACE) 2 MG tablet Take 2 mg by mouth daily. 05/05/21   [provider]  omeprazole (PRILOSEC) 40 MG capsule Take 1 capsule (40 mg total) by mouth daily. 05/08/21   Mansouraty, Telford Nab., MD  ondansetron (ZOFRAN-ODT) 4 MG disintegrating tablet Take 1 tablet (4 mg total) by mouth every 8 (eight) hours as needed for nausea or vomiting. 10/03/21   Davonna Belling, MD  oxyCODONE-acetaminophen (PERCOCET/ROXICET) 5-325 MG tablet Take 1-2 tablets by mouth every 8 (eight) hours as needed for severe pain.  10/03/21   Davonna Belling, MD  progesterone (PROMETRIUM) 100 MG capsule Take 100 mg by mouth at bedtime. 02/27/21   [provider]  rosuvastatin (CRESTOR) 10 MG tablet Take 10 mg by mouth daily. 05/04/21   [provider]  sucralfate (CARAFATE) 1 GM/10ML suspension Take 10 mLs (1 g total) by mouth 4 (four) times daily -  with meals and at bedtime. 08/29/21 08/29/22  Mansouraty, Telford Nab., MD      Allergies    Codeine    Review of Systems   Review of Systems  Genitourinary:  Positive for flank pain.    Physical Exam Updated Vital Signs BP (!) 114/49 (BP Location: Left Arm)   Pulse 66   Temp (!) 97.4 F (36.3 C) (Oral)   Resp 18   SpO2 100%  Physical Exam Vitals and nursing note reviewed.  HENT:     Head: Normocephalic.  Cardiovascular:     Rate and Rhythm: Regular rhythm.  Pulmonary:     Effort: Pulmonary effort is normal.  Abdominal:     Tenderness: There is abdominal tenderness.     Comments: Mild lower abdominal tenderness.  Skin:    Capillary Refill: Capillary refill takes less than 2 seconds.  Neurological:     Mental Status: She is alert and oriented to person, place, and time.     ED Results / Procedures / Treatments   Labs (all labs ordered are listed, but only abnormal results are displayed) Labs Reviewed -  No data to display  EKG None  Radiology DG Abdomen 1 View  Result Date: 10/04/2021 CLINICAL DATA:  Right-sided kidney stone. EXAM: ABDOMEN - 1 VIEW COMPARISON:  CT yesterday. FINDINGS: There are multiple phleboliths in the pelvis. Additionally 1 of these calcifications likely represents the distal ureteral stone on prior CT. No intrarenal calculi. Normal bowel gas pattern. IMPRESSION: Multiple phleboliths in the pelvis. Additionally 1 of these calcifications likely represents the distal ureteral stone on yesterday's CT. Electronically Signed   By: Keith Rake M.D.   On: 10/04/2021 12:16   CT Abdomen Pelvis W Contrast  Result  Date: 10/03/2021 CLINICAL DATA:  RIGHT lower quadrant abdominal pain EXAM: CT ABDOMEN AND PELVIS WITH CONTRAST TECHNIQUE: Multidetector CT imaging of the abdomen and pelvis was performed using the standard protocol following bolus administration of intravenous contrast. RADIATION DOSE REDUCTION: This exam was performed according to the departmental dose-optimization program which includes automated exposure control, adjustment of the mA and/or kV according to patient size and/or use of iterative reconstruction technique. CONTRAST:  120m OMNIPAQUE IOHEXOL 300 MG/ML SOLN IV. No oral contrast. COMPARISON:  08/28/2021 FINDINGS: Lower chest: Dependent atelectasis at lung bases Hepatobiliary: Gallbladder and liver normal appearance Pancreas: Normal appearance Spleen: Normal appearance Adrenals/Urinary Tract: Adrenal glands, LEFT kidney, and LEFT ureter normal appearance. Moderate RIGHT hydronephrosis and hydroureter with associated delay in RIGHT nephrogram. 3 mm distal RIGHT ureteral calculus adjacent to ureterovesical junction. No renal masses. Stomach/Bowel: Normal appendix. Stomach and bowel loops normal appearance. Vascular/Lymphatic: Vascular structures patent. Atherosclerotic calcifications aorta and iliac arteries without aneurysm. Scattered pelvic phleboliths. No adenopathy. Reproductive: Unremarkable uterus and adnexa Other: No free air or free fluid. No hernia or additional inflammatory process. Musculoskeletal: Unremarkable IMPRESSION: Moderate RIGHT hydronephrosis and hydroureter with associated delay in RIGHT nephrogram secondary to a 3 mm distal RIGHT ureteral calculus adjacent to ureterovesical junction. Aortic Atherosclerosis (ICD10-I70.0). Electronically Signed   By: MLavonia DanaM.D.   On: 10/03/2021 11:45    Procedures Procedures    Medications Ordered in ED Medications  ketorolac (TORADOL) 15 MG/ML injection 15 mg (15 mg Intravenous Given 10/04/21 1134)  ondansetron (ZOFRAN) injection 4 mg (4  mg Intravenous Given 10/04/21 1134)    ED Course/ Medical Decision Making/ A&P                           Medical Decision Making Amount and/or Complexity of Data Reviewed Radiology: ordered.  Risk Prescription drug management.   Patient with ureteral stone with pain.  Seen yesterday by me.  Uncontrolled pain.  Will give Toradol.  Reviewed labs from yesterday.  KUB done and independently interpreted me and does show multiple pelvic phleboliths and likely the stone.  Pain is improved after Toradol.  However has not been able to tolerate opiates either last night or in the past.  Discussed with Dr. GAbner Greenspanfrom urology.  Would be a candidate for some intervention since she has failed outpatient management.  However may have the trauma he needs to operate on.  We will keep patient n.p.o.  Care turned over to Dr. RWyvonnia DuskyWhich  Document critical care time when appropriate:1}      Final Clinical Impression(s) / ED Diagnoses Final diagnoses:  Ureteral stone with hydronephrosis    Rx / DC Orders ED Discharge Orders     None         PDavonna Belling MD 10/04/21 1617

## 2021-10-04 NOTE — ED Notes (Signed)
PT taken to short stay by staff for stent placement

## 2021-10-04 NOTE — Anesthesia Postprocedure Evaluation (Signed)
Anesthesia Post Note  Patient: Barbara Jimenez  Procedure(s) Performed: CYSTOSCOPY/URETEROSCOPY/HOLMIUM LASER/STENT PLACEMENT (Right: Ureter)     Patient location during evaluation: PACU Anesthesia Type: General Level of consciousness: awake and alert Pain management: pain level controlled Vital Signs Assessment: post-procedure vital signs reviewed and stable Respiratory status: spontaneous breathing, nonlabored ventilation and respiratory function stable Cardiovascular status: blood pressure returned to baseline and stable Postop Assessment: no apparent nausea or vomiting Anesthetic complications: no   No notable events documented.  Last Vitals:  Vitals:   10/04/21 2131 10/04/21 2158  BP: (!) 111/55 107/62  Pulse: 76 68  Resp: 17   Temp:  (!) 36.4 C  SpO2: 100% 100%    Last Pain:  Vitals:   10/04/21 2158  TempSrc:   PainSc: 0-No pain                 Myrikal Messmer

## 2021-10-04 NOTE — Anesthesia Procedure Notes (Signed)
Procedure Name: LMA Insertion Date/Time: 10/04/2021 8:28 PM  Performed by: Cleda Daub, CRNAPre-anesthesia Checklist: Patient identified, Emergency Drugs available, Suction available and Patient being monitored Patient Re-evaluated:Patient Re-evaluated prior to induction Oxygen Delivery Method: Circle system utilized Preoxygenation: Pre-oxygenation with 100% oxygen Induction Type: IV induction LMA: LMA inserted LMA Size: 4.0 Number of attempts: 1 Placement Confirmation: ETT inserted through vocal cords under direct vision, positive ETCO2 and breath sounds checked- equal and bilateral Tube secured with: Tape Dental Injury: Teeth and Oropharynx as per pre-operative assessment

## 2021-10-04 NOTE — H&P (Signed)
Urology H/P  Physician requesting consult: Davonna Belling, MD  Reason for consult: Right ureteral stone, refractory pain  History of Present Illness: Barbara Jimenez is a 56 y.o. presented to the ED today with persistent right flank pain.  She was seen in the ED yesterday for right-sided flank pain and found to have a 4 mm distal right ureteral stone.  She discharged home after pain was controlled however return to the ED today with persistent pain.  She denies fevers, chills, dysuria.  She does complain of nausea and emesis.  CT A/P 10/03/2021 demonstrated 3 mm distal right ureteral stone with moderate right hydronephrosis.  There were no other stones identified.  She denies a history of voiding or storage urinary symptoms, hematuria, UTIs, STDs, urolithiasis, GU malignancy/trauma/surgery.  Past Medical History:  Diagnosis Date   GERD (gastroesophageal reflux disease)    History of kidney stones    Hypercholesteremia    PONV (postoperative nausea and vomiting)     Past Surgical History:  Procedure Laterality Date   BIOPSY  05/08/2021   Procedure: BIOPSY;  Surgeon: Rush Landmark Telford Nab., MD;  Location: [facility];  Service: Gastroenterology;;   COLONOSCOPY     ECTOPIC PREGNANCY SURGERY     ESOPHAGEAL DILATION     ESOPHAGOGASTRODUODENOSCOPY N/A 05/08/2021   Procedure: ESOPHAGOGASTRODUODENOSCOPY (EGD);  Surgeon: Irving Copas., MD;  Location: [facility];  Service: Gastroenterology;  Laterality: N/A;   FOREIGN BODY REMOVAL  05/08/2021   Procedure: FOREIGN BODY REMOVAL;  Surgeon: Rush Landmark Telford Nab., MD;  Location: [facility];  Service: Gastroenterology;;     Current Hospital Medications:  Home meds:  No current facility-administered medications on file prior to encounter.   Current Outpatient Medications on File Prior to Encounter  Medication Sig Dispense Refill   bimatoprost (LATISSE) 0.03 % ophthalmic solution Place 1 drop into both eyes every other day.      cyclobenzaprine (FLEXERIL) 10 MG tablet Take 10 mg by mouth at bedtime as needed (for sleep).     estradiol (ESTRACE) 2 MG tablet Take 2 mg by mouth in the morning.     ibuprofen (ADVIL) 200 MG tablet Take 800 mg by mouth every 6 (six) hours as needed for mild pain or headache.     MAGNESIUM PO Take 2 tablets by mouth at bedtime.     NON FORMULARY Take 2 tablets by mouth See admin instructions. Viviscal tablets- Take 2 tablets by mouth at bedtime     omeprazole (PRILOSEC) 40 MG capsule Take 1 capsule (40 mg total) by mouth daily. (Patient taking differently: Take 40 mg by mouth daily as needed (for reflux or heartburn).) 60 capsule 12   ondansetron (ZOFRAN-ODT) 4 MG disintegrating tablet Take 1 tablet (4 mg total) by mouth every 8 (eight) hours as needed for nausea or vomiting. (Patient taking differently: Take 4 mg by mouth every 8 (eight) hours as needed for nausea or vomiting (DISSOLVE ORALLY).) 8 tablet 0   progesterone (PROMETRIUM) 100 MG capsule Take 100 mg by mouth at bedtime.     rosuvastatin (CRESTOR) 10 MG tablet Take 10 mg by mouth at bedtime.     sucralfate (CARAFATE) 1 GM/10ML suspension Take 10 mLs (1 g total) by mouth 4 (four) times daily -  with meals and at bedtime. (Patient taking differently: Take 1 g by mouth at bedtime as needed (to coat the stomach).) 1200 mL 5   oxyCODONE-acetaminophen (PERCOCET/ROXICET) 5-325 MG tablet Take 1-2 tablets by mouth every 8 (eight) hours as needed for  severe pain. (Patient not taking: Reported on 10/04/2021) 8 tablet 0     Scheduled Meds: Continuous Infusions:  acetaminophen     lactated ringers 100 mL/hr at 10/04/21 1651   PRN Meds:.acetaminophen  Allergies:  Allergies  Allergen Reactions   Codeine Itching   Other Nausea And Vomiting and Other (See Comments)    Narcotics do not agree with the patient- can tolerate Dilaudid, though    Family History  Problem Relation Age of Onset   Colon cancer Neg Hx    Esophageal cancer Neg Hx     Inflammatory bowel disease Neg Hx    Liver disease Neg Hx    Pancreatic cancer Neg Hx    Rectal cancer Neg Hx    Stomach cancer Neg Hx     Social History:  reports that she quit smoking about 25 years ago. Her smoking use included cigarettes. She has a 15.00 pack-year smoking history. She has never used smokeless tobacco. She reports current alcohol use. She reports that she does not currently use drugs.  ROS: A complete review of systems was performed.  All systems are negative except for pertinent findings as noted.  Physical Exam:  Vital signs in last 24 hours: Temp:  [97.4 F (36.3 C)-98.2 F (36.8 C)] 98.2 F (36.8 C) (08/24 1639) Pulse Rate:  [66-81] 81 (08/24 1639) Resp:  [16-20] 16 (08/24 1639) BP: (114-146)/(49-88) 122/54 (08/24 1639) SpO2:  [98 %-100 %] 99 % (08/24 1639) Weight:  [72.6 kg] 72.6 kg (08/24 1639) Constitutional:  Alert and oriented, No acute distress Cardiovascular: Regular rate and rhythm Respiratory: Normal respiratory effort, Lungs clear bilaterally GI: Abdomen is soft, nontender, nondistended, no abdominal masses GU: No CVA tenderness Neurologic: Grossly intact, no focal deficits Psychiatric: Normal mood and affect  Laboratory Data:  Recent Labs    10/03/21 0857  WBC 9.2  HGB 15.4*  HCT 47.7*  PLT 207    Recent Labs    10/03/21 0857  NA 141  K 3.9  CL 111  GLUCOSE 114*  BUN 22*  CALCIUM 11.1*  CREATININE 1.01*     No results found for this or any previous visit (from the past 24 hour(s)). No results found for this or any previous visit (from the past 240 hour(s)).  Renal Function: Recent Labs    10/03/21 0857  CREATININE 1.01*   Estimated Creatinine Clearance: 62 mL/min (A) (by C-G formula based on SCr of 1.01 mg/dL (H)).  Radiologic Imaging: DG Abdomen 1 View  Result Date: 10/04/2021 CLINICAL DATA:  Right-sided kidney stone. EXAM: ABDOMEN - 1 VIEW COMPARISON:  CT yesterday. FINDINGS: There are multiple phleboliths in the  pelvis. Additionally 1 of these calcifications likely represents the distal ureteral stone on prior CT. No intrarenal calculi. Normal bowel gas pattern. IMPRESSION: Multiple phleboliths in the pelvis. Additionally 1 of these calcifications likely represents the distal ureteral stone on yesterday's CT. Electronically Signed   By: Keith Rake M.D.   On: 10/04/2021 12:16   CT Abdomen Pelvis W Contrast  Result Date: 10/03/2021 CLINICAL DATA:  RIGHT lower quadrant abdominal pain EXAM: CT ABDOMEN AND PELVIS WITH CONTRAST TECHNIQUE: Multidetector CT imaging of the abdomen and pelvis was performed using the standard protocol following bolus administration of intravenous contrast. RADIATION DOSE REDUCTION: This exam was performed according to the departmental dose-optimization program which includes automated exposure control, adjustment of the mA and/or kV according to patient size and/or use of iterative reconstruction technique. CONTRAST:  129m OMNIPAQUE IOHEXOL 300 MG/ML SOLN  IV. No oral contrast. COMPARISON:  08/28/2021 FINDINGS: Lower chest: Dependent atelectasis at lung bases Hepatobiliary: Gallbladder and liver normal appearance Pancreas: Normal appearance Spleen: Normal appearance Adrenals/Urinary Tract: Adrenal glands, LEFT kidney, and LEFT ureter normal appearance. Moderate RIGHT hydronephrosis and hydroureter with associated delay in RIGHT nephrogram. 3 mm distal RIGHT ureteral calculus adjacent to ureterovesical junction. No renal masses. Stomach/Bowel: Normal appendix. Stomach and bowel loops normal appearance. Vascular/Lymphatic: Vascular structures patent. Atherosclerotic calcifications aorta and iliac arteries without aneurysm. Scattered pelvic phleboliths. No adenopathy. Reproductive: Unremarkable uterus and adnexa Other: No free air or free fluid. No hernia or additional inflammatory process. Musculoskeletal: Unremarkable IMPRESSION: Moderate RIGHT hydronephrosis and hydroureter with associated  delay in RIGHT nephrogram secondary to a 3 mm distal RIGHT ureteral calculus adjacent to ureterovesical junction. Aortic Atherosclerosis (ICD10-I70.0). Electronically Signed   By: Lavonia Dana M.D.   On: 10/03/2021 11:45    I independently reviewed the above imaging studies.  Impression/Recommendation: Right distal ureteral stone: CT A/P/23/23 with 3 mm distal right ureteral stone with moderate right hydronephrosis.  Refractory pain.  Afebrile, no leukocytosis, no AKI, urinalysis without sign of infection.  -I recommend cystoscopy, right ureteroscopy, laser lithotripsy and basket traction of stone with right ureteral stent placement.  -The risks, benefits and alternatives of above was discussed with the patient.  Risks include, but are not limited to: bleeding, urinary tract infection, ureteral injury, ureteral stricture disease, chronic pain, urinary symptoms, bladder injury, stent migration, the need for nephrostomy tube placement, MI, CVA, DVT, PE and the inherent risks with general anesthesia.  The patient voices understanding and wishes to proceed.   Matt R. Rae Plotner MD 10/04/2021, 8:15 PM  [facility]  Pager: [phone]   CC: Davonna Belling, MD

## 2021-10-04 NOTE — Anesthesia Preprocedure Evaluation (Signed)
Anesthesia Evaluation  Patient identified by MRN, date of birth, ID band Patient awake    Reviewed: Allergy & Precautions, NPO status , Patient's Chart, lab work & pertinent test results  History of Anesthesia Complications (+) PONV and history of anesthetic complications  Airway Mallampati: I  TM Distance: >3 FB Neck ROM: Full    Dental  (+) Teeth Intact, Dental Advisory Given   Pulmonary former smoker,    breath sounds clear to auscultation       Cardiovascular negative cardio ROS   Rhythm:Regular     Neuro/Psych negative neurological ROS  negative psych ROS   GI/Hepatic negative GI ROS, Neg liver ROS,   Endo/Other  negative endocrine ROS  Renal/GU Renal disease Right ureteral stone  Lab Results      Component                Value               Date                      CREATININE               1.01 (H)            10/03/2021                Musculoskeletal negative musculoskeletal ROS (+)   Abdominal   Peds  Hematology negative hematology ROS (+) Lab Results      Component                Value               Date                      WBC                      9.2                 10/03/2021                HGB                      15.4 (H)            10/03/2021                HCT                      47.7 (H)            10/03/2021                MCV                      97.5                10/03/2021                PLT                      207                 10/03/2021              Anesthesia Other Findings   Reproductive/Obstetrics  Anesthesia Physical Anesthesia Plan  ASA: 2  Anesthesia Plan: General   Post-op Pain Management: Ofirmev IV (intra-op)*   Induction:   PONV Risk Score and Plan: 4 or greater and Ondansetron, Dexamethasone, Propofol infusion and TIVA  Airway Management Planned: Oral ETT and LMA  Additional Equipment: None  Intra-op  Plan:   Post-operative Plan: Extubation in OR  Informed Consent: I have reviewed the patients History and Physical, chart, labs and discussed the procedure including the risks, benefits and alternatives for the proposed anesthesia with the patient or authorized representative who has indicated his/her understanding and acceptance.     Dental advisory given  Plan Discussed with: CRNA  Anesthesia Plan Comments:         Anesthesia Quick Evaluation

## 2021-10-05 ENCOUNTER — Encounter (HOSPITAL_COMMUNITY): Payer: Self-pay | Admitting: Urology

## 2021-11-05 ENCOUNTER — Other Ambulatory Visit: Payer: Self-pay | Admitting: Endocrinology

## 2021-11-05 ENCOUNTER — Other Ambulatory Visit (HOSPITAL_COMMUNITY): Payer: Self-pay | Admitting: Endocrinology

## 2021-11-05 DIAGNOSIS — E210 Primary hyperparathyroidism: Secondary | ICD-10-CM

## 2021-11-19 ENCOUNTER — Encounter (HOSPITAL_COMMUNITY)
Admission: RE | Admit: 2021-11-19 | Discharge: 2021-11-19 | Disposition: A | Discharge status: Home / Self Care | Payer: 59 | Source: Ambulatory Visit | Attending: Endocrinology | Admitting: Endocrinology

## 2021-11-19 DIAGNOSIS — E210 Primary hyperparathyroidism: Secondary | ICD-10-CM | POA: Diagnosis present

## 2021-11-19 MED ORDER — TECHNETIUM TC 99M SESTAMIBI GENERIC - CARDIOLITE
24.6000 | Freq: Once | INTRAVENOUS | Status: AC | PRN
  Administered 2021-11-19: 24.6 via INTRAVENOUS

## 2021-12-10 ENCOUNTER — Other Ambulatory Visit: Payer: Self-pay | Admitting: Surgery

## 2021-12-10 DIAGNOSIS — E210 Primary hyperparathyroidism: Secondary | ICD-10-CM

## 2021-12-11 ENCOUNTER — Ambulatory Visit
Admission: RE | Admit: 2021-12-11 | Discharge: 2021-12-11 | Disposition: A | Discharge status: Home / Self Care | Payer: 59 | Source: Ambulatory Visit | Attending: Surgery | Admitting: Surgery

## 2021-12-11 DIAGNOSIS — E210 Primary hyperparathyroidism: Secondary | ICD-10-CM

## 2021-12-19 ENCOUNTER — Ambulatory Visit: Payer: Self-pay | Admitting: Surgery

## 2022-01-06 ENCOUNTER — Encounter (HOSPITAL_COMMUNITY): Payer: Self-pay | Admitting: Surgery

## 2022-01-06 DIAGNOSIS — E210 Primary hyperparathyroidism: Secondary | ICD-10-CM | POA: Diagnosis present

## 2022-01-06 NOTE — H&P (Signed)
REFERRING PHYSICIAN: Birdena Crandall, MD  PROVIDER: Rilley Poulter Charlotta Newton, MD   Chief Complaint: New Consultation (Primary hyperparathyroidism)  History of Present Illness:  Patient is referred by Dr. Jacelyn Pi for surgical evaluation and management of primary hyperparathyroidism. Patient had been noted on routine laboratory studies by her gynecologist to have an elevated serum calcium level. This is ranged from 10.8 to as high as 12.0. Recent intact PTH level was elevated at 158. Vitamin D level was normal at 54.6. Patient underwent nuclear medicine parathyroid scan on November 19, 2021. This localized both a right and left inferior parathyroid adenoma. Patient is now referred to consider surgery. Patient has no prior history of head or neck surgery. She does have a history of a thyroid nodule for which she underwent fine-needle aspiration biopsy approximately 5 years ago with benign cytopathology. Patient has noted significant fatigue. She describes bone and joint pain. She has had problems with nephrolithiasis requiring lithotripsy. She underwent a bone density scan which documented osteoporosis. 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 Her daughter is a Immunologist at [employer].  Review of Systems: A complete review of systems was obtained from the patient. I have reviewed this information and discussed as appropriate with the patient. See HPI as well for other ROS.  Review of Systems Constitutional: Positive for malaise/fatigue. HENT: Negative. Eyes: Negative. Respiratory: Negative. Cardiovascular: Negative. Gastrointestinal: Negative. Genitourinary: Nephrolithiasis Musculoskeletal: Positive for joint pain. Skin: Negative. Neurological: Negative. Endo/Heme/Allergies: Negative. Psychiatric/Behavioral: Negative.   Medical History: Past Medical History: Diagnosis Date GERD (gastroesophageal reflux  disease) Hyperlipidemia  Patient Active Problem List Diagnosis Primary hyperparathyroidism (CMS-HCC) Thyroid nodule  Past Surgical History: Procedure Laterality Date COMBINED AUGMENTATION MAMMAPLASTY AND ABDOMINOPLASTY Facelift LIPOSUCTION TRUNK Tummy tuck   Allergies Allergen Reactions Codeine Itching and Nausea And Vomiting Oxycodone Nausea  Current Outpatient Medications on File Prior to Visit Medication Sig Dispense Refill omeprazole (PRILOSEC) 40 MG DR capsule TAKE 1 CAPSULE (40 MG TOTAL) BY MOUTH DAILY. spironolactone (ALDACTONE) 100 MG tablet 1 tablet Orally Once a day for 30 days bimatoprost (LATISSE) 0.03 % ophthalmic solution bimatoprost 0.03 % drops with applicator, eyelash base APPLY ONCE A DAY FOR ONE MONTH AND THEN 3 TIMES A WEEK cyclobenzaprine (FLEXERIL) 10 MG tablet Take by mouth as directed estradioL (ESTRACE) 0.01 % (0.1 mg/gram) vaginal cream estradiol 0.01% (0.1 mg/gram) vaginal cream (Patient not taking: Reported on 02/20/2021) estradioL (ESTRACE) 2 MG tablet estradiol 2 mg tablet TAKE 1 TABLET BY MOUTH EVERY DAY Herbal Supplement Take by mouth once daily VITAMIN D Herbal Supplement Take by mouth once daily VIVISCAL ibuprofen (MOTRIN) 800 MG tablet as directed Lactobacillus acidophilus (PROBIOTIC ORAL) Take by mouth once daily MAGNESIUM ORAL Take by mouth once daily phenazopyridine (PYRIDIUM) 100 MG tablet phenazopyridine 100 mg tablet (Patient not taking: Reported on 02/20/2021) progesterone (PROMETRIUM) 100 MG capsule Take 100 mg by mouth once daily rosuvastatin (CRESTOR) 10 MG tablet rosuvastatin 10 mg tablet TAKE 1 TABLET BY MOUTH EVERY DAY AS DIRECTED  No current facility-administered medications on file prior to visit.  History reviewed. No pertinent family history.  Social History  Tobacco Use Smoking Status Never Smokeless Tobacco Never   Social History  Socioeconomic History Marital status: Married Tobacco Use Smoking status:  Never Smokeless tobacco: Never Substance and Sexual Activity Alcohol use: Yes Comment: 2 drinks per week Drug use: Never  Objective:  Vitals: BP: 120/70 Pulse: 103 Temp: 36.7 C (98 F) SpO2: 98% Weight: 76.2 kg (168 lb)  Height: 170.2 cm ('5\' 7"'$ )  Body mass index is 26.31 kg/m.  Physical Exam  GENERAL APPEARANCE Comfortable, no acute issues Development: normal Gross deformities: none  SKIN Rash, lesions, ulcers: none Induration, erythema: none Nodules: none palpable  EYES Conjunctiva and lids: normal Pupils: equal and reactive  EARS, NOSE, MOUTH, THROAT External ears: no lesion or deformity External nose: no lesion or deformity Hearing: grossly normal  NECK Symmetric: yes Trachea: midline Thyroid: There is a palpable smooth approximately 2 cm nodule in the inferior left thyroid lobe. This is nontender and mobile with swallowing. There is no palpable abnormality on the right side. There is no associated lymphadenopathy.  CHEST Respiratory effort: normal Retraction or accessory muscle use: no Breath sounds: normal bilaterally Rales, rhonchi, wheeze: none  CARDIOVASCULAR Auscultation: regular rhythm, normal rate Murmurs: none Pulses: radial pulse 2+ palpable Lower extremity edema: none  ABDOMEN Not assessed  GENITOURINARY/RECTAL Not assessed  MUSCULOSKELETAL Station and gait: normal Digits and nails: no clubbing or cyanosis Muscle strength: grossly normal all extremities Range of motion: grossly normal all extremities Deformity: none  LYMPHATIC Cervical: none palpable Supraclavicular: none palpable  PSYCHIATRIC Oriented to person, place, and time: yes Mood and affect: normal for situation Judgment and insight: appropriate for situation   Assessment and Plan:  Primary hyperparathyroidism (CMS-HCC) - US soft tissue head and neck; Future - Cancel: Parathyroid Hormone (PTH); Future - Cancel: Vitamin D, 25-Hydroxy - [facility];  Future  Thyroid nodule  Patient is referred by her endocrinologist for surgical evaluation and management of primary hyperparathyroidism.  Patient provided with a copy of "Parathyroid Surgery: Treatment for Your Parathyroid Gland Problem", published by [employer], 12 pages. Book reviewed and explained to patient during visit today.  Today we reviewed her clinical history. We reviewed her laboratory studies as well as her nuclear medicine scan. Patient has biochemical evidence of primary hyperparathyroidism. She is symptomatic with complications including osteoporosis, nephrolithiasis, and fatigue. Imaging studies localize bilateral inferior parathyroid adenomas. We discussed performing minimally invasive surgery for parathyroidectomy. We discussed doing this as an outpatient procedure with the possibility of an overnight stay. We discussed the size and location of the surgical incision. We discussed potential complications including the risk of recurrent laryngeal nerve injury. I would like for the patient to undergo an ultrasound examination in hopes of identifying the parathyroid adenomas and confirming their location as well as to evaluate for any concurrent thyroid disease which may require management. The patient understands and agrees to proceed.  Patient will undergo the ultrasound examination. I will contact her with those results when they are available. We will make a decision at that time regarding proceeding with parathyroid surgery.  Armandina Gemma, MD [facility] [place] practice Office: [phone]

## 2022-01-08 NOTE — Progress Notes (Signed)
COVID Vaccine Completed:no  Date of COVID positive in last 90 days: no  PCP - Loma Boston, MD Cardiologist - Daneen Schick, MD- more than 10 years ago, no longer   Chest x-ray - 05/08/21 Epic EKG - 05/09/21 Epic Stress Test - last jan per pt ECHO - 02/20/21 CE Cardiac Cath - n/a Pacemaker/ICD device last checked: n/a Spinal Cord Stimulator:n/a  Bowel Prep - no  Sleep Study - n/a CPAP -   Fasting Blood Sugar - n/a Checks Blood Sugar _____ times a day  Last dose of GLP1 agonist-  N/A GLP1 instructions:  N/A   Last dose of SGLT-2 inhibitors-  N/A SGLT-2 instructions: N/A   Blood Thinner Instructions: n/a Aspirin Instructions: Last Dose:  Activity level: Can go up a flight of stairs and perform activities of daily living without stopping and without symptoms of chest pain or shortness of breath.    Anesthesia review:   Patient denies shortness of breath, fever, cough and chest pain at PAT appointment  Patient verbalized understanding of instructions that were given to them at the PAT appointment. Patient was also instructed that they will need to review over the PAT instructions again at home before surgery.

## 2022-01-08 NOTE — Patient Instructions (Signed)
SURGICAL WAITING ROOM VISITATION Patients having surgery or a procedure may have no more than 2 support people in the waiting area - these visitors may rotate.   Children under the age of 46 must have an adult with them who is not the patient. If the patient needs to stay at the hospital during part of their recovery, the visitor guidelines for inpatient rooms apply. Pre-op nurse will coordinate an appropriate time for 1 support person to accompany patient in pre-op.  This support person may not rotate.    Please refer to the [facility] website for the visitor guidelines for Inpatients (after your surgery is over and you are in a regular room).    Your procedure is scheduled on: 01/10/22   Report to [facility] Main Entrance    Report to admitting at 9:30 AM   Call this number if you have problems the morning of surgery [phone]   Do not eat food :After Midnight.   After Midnight you may have the following liquids until 8:30 AM DAY OF SURGERY  Water Non-Citrus Juices (without pulp, NO RED) Carbonated Beverages Black Coffee (NO MILK/CREAM OR CREAMERS, sugar ok)  Clear Tea (NO MILK/CREAM OR CREAMERS, sugar ok) regular and decaf                             Plain Jell-O (NO RED)                                           Fruit ices (not with fruit pulp, NO RED)                                     Popsicles (NO RED)                                                               Sports drinks like Gatorade (NO RED)                      If you have questions, please contact your surgeon's office.   FOLLOW BOWEL PREP AND ANY ADDITIONAL PRE OP INSTRUCTIONS YOU RECEIVED FROM YOUR SURGEON'S OFFICE!!!     Oral Hygiene is also important to reduce your risk of infection.                                    Remember - BRUSH YOUR TEETH THE MORNING OF SURGERY WITH YOUR REGULAR TOOTHPASTE   Take these medicines the morning of surgery with A SIP OF WATER: Omeprazole                                You may not have any metal on your body including hair pins, jewelry, and body piercing             Do not wear make-up, lotions, powders, perfumes, or deodorant  Do not wear nail polish  including gel and S&S, artificial/acrylic nails, or any other type of covering on natural nails including finger and toenails. If you have artificial nails, gel coating, etc. that needs to be removed by a nail salon please have this removed prior to surgery or surgery may need to be canceled/ delayed if the surgeon/ anesthesia feels like they are unable to be safely monitored.   Do not shave  48 hours prior to surgery.    Do not bring valuables to the hospital. [place].  DO NOT [place]. PHARMACY WILL DISPENSE MEDICATIONS LISTED ON YOUR MEDICATION LIST TO YOU DURING YOUR ADMISSION [place]!    Patients discharged on the day of surgery will not be allowed to drive home.  Someone NEEDS to stay with you for the first 24 hours after anesthesia.              Please read over the following fact sheets you were given: IF [place] [id number] Schneiders    If you received a COVID test during your pre-op visit  it is requested that you wear a mask when out in public, stay away from anyone that may not be feeling well and notify your surgeon if you develop symptoms. If you test positive for Covid or have been in contact with anyone that has tested positive in the last 10 days please notify you surgeon.    [place] - Preparing for Surgery Before surgery, you can play an important role.  Because skin is not sterile, your skin needs to be as free of germs as possible.  You can reduce the number of germs on your skin by washing with CHG (chlorahexidine gluconate) soap before surgery.  CHG is an antiseptic cleaner which kills germs and bonds with the skin to continue  killing germs even after washing. Please DO NOT use if you have an allergy to CHG or antibacterial soaps.  If your skin becomes reddened/irritated stop using the CHG and inform your nurse when you arrive at Short Stay. Do not shave (including legs and underarms) for at least 48 hours prior to the first CHG shower.  You may shave your face/neck.  Please follow these instructions carefully:  1.  Shower with CHG Soap the night before surgery and the  morning of surgery.  2.  If you choose to wash your hair, wash your hair first as usual with your normal  shampoo.  3.  After you shampoo, rinse your hair and body thoroughly to remove the shampoo.                             4.  Use CHG as you would any other liquid soap.  You can apply chg directly to the skin and wash.  Gently with a scrungie or clean washcloth.  5.  Apply the CHG Soap to your body ONLY FROM THE NECK DOWN.   Do   not use on face/ open                           Wound or open sores. Avoid contact with eyes, ears mouth and   genitals (private parts).  Wash face,  Genitals (private parts) with your normal soap.             6.  Wash thoroughly, paying special attention to the area where your    surgery  will be performed.  7.  Thoroughly rinse your body with warm water from the neck down.  8.  DO NOT shower/wash with your normal soap after using and rinsing off the CHG Soap.                9.  Pat yourself dry with a clean towel.            10.  Wear clean pajamas.            11.  Place clean sheets on your bed the night of your first shower and do not  sleep with pets. Day of Surgery : Do not apply any lotions/deodorants the morning of surgery.  Please wear clean clothes to the hospital/surgery center.  FAILURE TO FOLLOW THESE INSTRUCTIONS MAY RESULT IN THE CANCELLATION OF YOUR SURGERY  PATIENT SIGNATURE_________________________________  NURSE  SIGNATURE__________________________________  ________________________________________________________________________

## 2022-01-09 ENCOUNTER — Encounter (HOSPITAL_COMMUNITY): Payer: Self-pay

## 2022-01-09 ENCOUNTER — Encounter (HOSPITAL_COMMUNITY)
Admission: RE | Admit: 2022-01-09 | Discharge: 2022-01-09 | Disposition: A | Discharge status: Home / Self Care | Payer: 59 | Source: Ambulatory Visit | Attending: Surgery | Admitting: Surgery

## 2022-01-09 VITALS — BP 120/50 | HR 73 | Temp 97.9°F | Resp 12 | Ht 67.0 in | Wt 163.0 lb

## 2022-01-09 DIAGNOSIS — Z01818 Encounter for other preprocedural examination: Secondary | ICD-10-CM

## 2022-01-09 DIAGNOSIS — Z01812 Encounter for preprocedural laboratory examination: Secondary | ICD-10-CM | POA: Insufficient documentation

## 2022-01-09 HISTORY — DX: Family history of other specified conditions: Z84.89

## 2022-01-09 HISTORY — DX: Headache, unspecified: R51.9

## 2022-01-09 LAB — CBC
HCT: 48.9 % — ABNORMAL HIGH (ref 36.0–46.0)
Hemoglobin: 15.8 g/dL — ABNORMAL HIGH (ref 12.0–15.0)
MCH: 31.3 pg (ref 26.0–34.0)
MCHC: 32.3 g/dL (ref 30.0–36.0)
MCV: 96.8 fL (ref 80.0–100.0)
Platelets: 241 10*3/uL (ref 150–400)
RBC: 5.05 MIL/uL (ref 3.87–5.11)
RDW: 12.8 % (ref 11.5–15.5)
WBC: 5.5 10*3/uL (ref 4.0–10.5)
nRBC: 0 % (ref 0.0–0.2)

## 2022-01-10 ENCOUNTER — Ambulatory Visit (HOSPITAL_COMMUNITY): Payer: 59 | Admitting: Anesthesiology

## 2022-01-10 ENCOUNTER — Ambulatory Visit (HOSPITAL_COMMUNITY)
Admission: RE | Admit: 2022-01-10 | Discharge: 2022-01-11 | Disposition: A | Discharge status: Home / Self Care | Payer: 59 | Source: Ambulatory Visit | Attending: Surgery | Admitting: Surgery

## 2022-01-10 ENCOUNTER — Other Ambulatory Visit: Payer: Self-pay

## 2022-01-10 ENCOUNTER — Encounter (HOSPITAL_COMMUNITY): Payer: Self-pay | Admitting: Surgery

## 2022-01-10 ENCOUNTER — Encounter (HOSPITAL_COMMUNITY)
Admission: RE | Disposition: A | Discharge status: Home / Self Care | Payer: Self-pay | Source: Ambulatory Visit | Attending: Surgery

## 2022-01-10 ENCOUNTER — Ambulatory Visit (HOSPITAL_BASED_OUTPATIENT_CLINIC_OR_DEPARTMENT_OTHER): Payer: 59 | Admitting: Anesthesiology

## 2022-01-10 DIAGNOSIS — K219 Gastro-esophageal reflux disease without esophagitis: Secondary | ICD-10-CM | POA: Insufficient documentation

## 2022-01-10 DIAGNOSIS — E210 Primary hyperparathyroidism: Secondary | ICD-10-CM | POA: Diagnosis present

## 2022-01-10 DIAGNOSIS — E214 Other specified disorders of parathyroid gland: Secondary | ICD-10-CM | POA: Diagnosis not present

## 2022-01-10 DIAGNOSIS — Z87442 Personal history of urinary calculi: Secondary | ICD-10-CM | POA: Insufficient documentation

## 2022-01-10 DIAGNOSIS — Z87891 Personal history of nicotine dependence: Secondary | ICD-10-CM | POA: Insufficient documentation

## 2022-01-10 DIAGNOSIS — D351 Benign neoplasm of parathyroid gland: Secondary | ICD-10-CM | POA: Diagnosis not present

## 2022-01-10 DIAGNOSIS — M810 Age-related osteoporosis without current pathological fracture: Secondary | ICD-10-CM | POA: Insufficient documentation

## 2022-01-10 DIAGNOSIS — E041 Nontoxic single thyroid nodule: Secondary | ICD-10-CM | POA: Diagnosis not present

## 2022-01-10 HISTORY — PX: PARATHYROIDECTOMY: SHX19

## 2022-01-10 SURGERY — PARATHYROIDECTOMY
Anesthesia: General | Site: Neck

## 2022-01-10 MED ORDER — HEMOSTATIC AGENTS (NO CHARGE) OPTIME
TOPICAL | Status: DC | PRN
  Administered 2022-01-10: 1

## 2022-01-10 MED ORDER — PROPOFOL 500 MG/50ML IV EMUL
INTRAVENOUS | Status: DC | PRN
  Administered 2022-01-10: 150 ug/kg/min via INTRAVENOUS

## 2022-01-10 MED ORDER — CHLORHEXIDINE GLUCONATE CLOTH 2 % EX PADS: 6.0000 | MEDICATED_PAD | Freq: Once | CUTANEOUS | Status: DC

## 2022-01-10 MED ORDER — CEFAZOLIN SODIUM-DEXTROSE 2-4 GM/100ML-% IV SOLN
2.0000 g | INTRAVENOUS | Status: AC
  Administered 2022-01-10: 2 g via INTRAVENOUS
  Filled 2022-01-10: qty 100

## 2022-01-10 MED ORDER — ROCURONIUM BROMIDE 10 MG/ML (PF) SYRINGE
PREFILLED_SYRINGE | INTRAVENOUS | Status: AC
  Filled 2022-01-10: qty 10

## 2022-01-10 MED ORDER — SCOPOLAMINE 1 MG/3DAYS TD PT72
1.0000 | MEDICATED_PATCH | Freq: Once | TRANSDERMAL | Status: DC
  Administered 2022-01-10: 1.5 mg via TRANSDERMAL
  Filled 2022-01-10: qty 1

## 2022-01-10 MED ORDER — ORAL CARE MOUTH RINSE: 15.0000 mL | Freq: Once | OROMUCOSAL | Status: AC

## 2022-01-10 MED ORDER — MIDAZOLAM HCL 2 MG/2ML IJ SOLN: 0.5000 mg | Freq: Once | INTRAMUSCULAR | Status: DC | PRN

## 2022-01-10 MED ORDER — DUTASTERIDE 0.5 MG PO CAPS
0.5000 mg | ORAL_CAPSULE | Freq: Every day | ORAL | Status: DC
  Administered 2022-01-10 – 2022-01-11 (×2): 0.5 mg via ORAL
  Filled 2022-01-10 (×2): qty 1

## 2022-01-10 MED ORDER — CHLORHEXIDINE GLUCONATE 0.12 % MT SOLN
15.0000 mL | Freq: Once | OROMUCOSAL | Status: AC
  Administered 2022-01-10: 15 mL via OROMUCOSAL

## 2022-01-10 MED ORDER — OXYCODONE HCL 5 MG PO TABS: 5.0000 mg | ORAL_TABLET | ORAL | Status: DC | PRN

## 2022-01-10 MED ORDER — HYDROMORPHONE HCL 1 MG/ML IJ SOLN
0.2500 mg | INTRAMUSCULAR | Status: DC | PRN
  Administered 2022-01-10: 0.5 mg via INTRAVENOUS

## 2022-01-10 MED ORDER — TRAMADOL HCL 50 MG PO TABS: 50.0000 mg | ORAL_TABLET | Freq: Four times a day (QID) | ORAL | Status: DC | PRN

## 2022-01-10 MED ORDER — FENTANYL CITRATE (PF) 250 MCG/5ML IJ SOLN
INTRAMUSCULAR | Status: AC
  Filled 2022-01-10: qty 5

## 2022-01-10 MED ORDER — PROPOFOL 10 MG/ML IV BOLUS
INTRAVENOUS | Status: AC
  Filled 2022-01-10: qty 20

## 2022-01-10 MED ORDER — SPIRONOLACTONE 100 MG PO TABS
100.0000 mg | ORAL_TABLET | Freq: Every day | ORAL | Status: DC
  Administered 2022-01-10 – 2022-01-11 (×2): 100 mg via ORAL
  Filled 2022-01-10 (×2): qty 1

## 2022-01-10 MED ORDER — ACETAMINOPHEN 500 MG PO TABS
1000.0000 mg | ORAL_TABLET | Freq: Once | ORAL | Status: AC
  Administered 2022-01-10: 1000 mg via ORAL
  Filled 2022-01-10: qty 2

## 2022-01-10 MED ORDER — DEXAMETHASONE SODIUM PHOSPHATE 10 MG/ML IJ SOLN
INTRAMUSCULAR | Status: AC
  Filled 2022-01-10: qty 1

## 2022-01-10 MED ORDER — PROPOFOL 10 MG/ML IV BOLUS
INTRAVENOUS | Status: DC | PRN
  Administered 2022-01-10: 200 mg via INTRAVENOUS

## 2022-01-10 MED ORDER — 0.9 % SODIUM CHLORIDE (POUR BTL) OPTIME
TOPICAL | Status: DC | PRN
  Administered 2022-01-10: 1000 mL

## 2022-01-10 MED ORDER — OXYCODONE HCL 5 MG/5ML PO SOLN: 5.0000 mg | Freq: Once | ORAL | Status: AC | PRN

## 2022-01-10 MED ORDER — ACETAMINOPHEN 325 MG PO TABS: 650.0000 mg | ORAL_TABLET | Freq: Four times a day (QID) | ORAL | Status: DC | PRN

## 2022-01-10 MED ORDER — PROPOFOL 1000 MG/100ML IV EMUL
INTRAVENOUS | Status: AC
  Filled 2022-01-10: qty 200

## 2022-01-10 MED ORDER — BUPIVACAINE HCL (PF) 0.25 % IJ SOLN
INTRAMUSCULAR | Status: AC
  Filled 2022-01-10: qty 10

## 2022-01-10 MED ORDER — HYDROMORPHONE HCL 1 MG/ML IJ SOLN
1.0000 mg | INTRAMUSCULAR | Status: DC | PRN
  Administered 2022-01-10 – 2022-01-11 (×6): 1 mg via INTRAVENOUS
  Filled 2022-01-10 (×6): qty 1

## 2022-01-10 MED ORDER — PROGESTERONE MICRONIZED 100 MG PO CAPS
100.0000 mg | ORAL_CAPSULE | Freq: Every day | ORAL | Status: DC
  Administered 2022-01-10: 100 mg via ORAL
  Filled 2022-01-10: qty 1

## 2022-01-10 MED ORDER — ONDANSETRON HCL 4 MG/2ML IJ SOLN
INTRAMUSCULAR | Status: DC | PRN
  Administered 2022-01-10: 4 mg via INTRAVENOUS

## 2022-01-10 MED ORDER — ONDANSETRON 4 MG PO TBDP: 4.0000 mg | ORAL_TABLET | Freq: Four times a day (QID) | ORAL | Status: DC | PRN

## 2022-01-10 MED ORDER — ROCURONIUM BROMIDE 10 MG/ML (PF) SYRINGE
PREFILLED_SYRINGE | INTRAVENOUS | Status: DC | PRN
  Administered 2022-01-10 (×2): 20 mg via INTRAVENOUS
  Administered 2022-01-10: 50 mg via INTRAVENOUS

## 2022-01-10 MED ORDER — MIDAZOLAM HCL 2 MG/2ML IJ SOLN
INTRAMUSCULAR | Status: AC
  Filled 2022-01-10: qty 2

## 2022-01-10 MED ORDER — MIDAZOLAM HCL 5 MG/5ML IJ SOLN
INTRAMUSCULAR | Status: DC | PRN
  Administered 2022-01-10: 2 mg via INTRAVENOUS

## 2022-01-10 MED ORDER — DEXAMETHASONE SODIUM PHOSPHATE 10 MG/ML IJ SOLN
INTRAMUSCULAR | Status: DC | PRN
  Administered 2022-01-10: 10 mg via INTRAVENOUS

## 2022-01-10 MED ORDER — OXYCODONE HCL 5 MG PO TABS
ORAL_TABLET | ORAL | Status: AC
  Filled 2022-01-10: qty 1

## 2022-01-10 MED ORDER — PHENYLEPHRINE 80 MCG/ML (10ML) SYRINGE FOR IV PUSH (FOR BLOOD PRESSURE SUPPORT)
PREFILLED_SYRINGE | INTRAVENOUS | Status: DC | PRN
  Administered 2022-01-10: 80 ug via INTRAVENOUS
  Administered 2022-01-10: 160 ug via INTRAVENOUS
  Administered 2022-01-10 (×2): 80 ug via INTRAVENOUS
  Administered 2022-01-10: 160 ug via INTRAVENOUS
  Administered 2022-01-10: 80 ug via INTRAVENOUS
  Administered 2022-01-10: 240 ug via INTRAVENOUS

## 2022-01-10 MED ORDER — PROMETHAZINE HCL 25 MG/ML IJ SOLN: 6.2500 mg | INTRAMUSCULAR | Status: DC | PRN

## 2022-01-10 MED ORDER — FENTANYL CITRATE (PF) 250 MCG/5ML IJ SOLN
INTRAMUSCULAR | Status: DC | PRN
  Administered 2022-01-10: 150 ug via INTRAVENOUS
  Administered 2022-01-10 (×2): 50 ug via INTRAVENOUS

## 2022-01-10 MED ORDER — ONDANSETRON HCL 4 MG/2ML IJ SOLN
INTRAMUSCULAR | Status: AC
  Filled 2022-01-10: qty 2

## 2022-01-10 MED ORDER — LACTATED RINGERS IV SOLN: INTRAVENOUS | Status: DC

## 2022-01-10 MED ORDER — MEPERIDINE HCL 50 MG/ML IJ SOLN: 6.2500 mg | INTRAMUSCULAR | Status: DC | PRN

## 2022-01-10 MED ORDER — LIDOCAINE 2% (20 MG/ML) 5 ML SYRINGE
INTRAMUSCULAR | Status: DC | PRN
  Administered 2022-01-10: 40 mg via INTRAVENOUS

## 2022-01-10 MED ORDER — OXYCODONE HCL 5 MG PO TABS
5.0000 mg | ORAL_TABLET | Freq: Once | ORAL | Status: AC | PRN
  Administered 2022-01-10: 5 mg via ORAL

## 2022-01-10 MED ORDER — BUPIVACAINE HCL 0.25 % IJ SOLN
INTRAMUSCULAR | Status: DC | PRN
  Administered 2022-01-10: 10 mL

## 2022-01-10 MED ORDER — PHENYLEPHRINE 80 MCG/ML (10ML) SYRINGE FOR IV PUSH (FOR BLOOD PRESSURE SUPPORT)
PREFILLED_SYRINGE | INTRAVENOUS | Status: AC
  Filled 2022-01-10: qty 10

## 2022-01-10 MED ORDER — ACETAMINOPHEN 650 MG RE SUPP: 650.0000 mg | Freq: Four times a day (QID) | RECTAL | Status: DC | PRN

## 2022-01-10 MED ORDER — SODIUM CHLORIDE 0.45 % IV SOLN: INTRAVENOUS | Status: DC

## 2022-01-10 MED ORDER — LIDOCAINE HCL (PF) 2 % IJ SOLN
INTRAMUSCULAR | Status: AC
  Filled 2022-01-10: qty 5

## 2022-01-10 MED ORDER — HYDROMORPHONE HCL 1 MG/ML IJ SOLN
INTRAMUSCULAR | Status: AC
  Administered 2022-01-10: 0.5 mg via INTRAVENOUS
  Filled 2022-01-10: qty 1

## 2022-01-10 MED ORDER — SUGAMMADEX SODIUM 200 MG/2ML IV SOLN
INTRAVENOUS | Status: DC | PRN
  Administered 2022-01-10: 150 mg via INTRAVENOUS

## 2022-01-10 MED ORDER — ESTRADIOL 0.5 MG PO TABS
2.0000 mg | ORAL_TABLET | Freq: Every day | ORAL | Status: DC
  Administered 2022-01-11: 2 mg via ORAL
  Filled 2022-01-10: qty 4

## 2022-01-10 MED ORDER — MAGNESIUM OXIDE -MG SUPPLEMENT 400 (240 MG) MG PO TABS
200.0000 mg | ORAL_TABLET | Freq: Two times a day (BID) | ORAL | Status: DC
  Administered 2022-01-10 – 2022-01-11 (×2): 200 mg via ORAL
  Filled 2022-01-10 (×2): qty 1

## 2022-01-10 MED ORDER — ONDANSETRON HCL 4 MG/2ML IJ SOLN: 4.0000 mg | Freq: Four times a day (QID) | INTRAMUSCULAR | Status: DC | PRN

## 2022-01-10 SURGICAL SUPPLY — 35 items
ADH SKN CLS APL DERMABOND .7 (GAUZE/BANDAGES/DRESSINGS) ×1
APL PRP STRL LF DISP 70% ISPRP (MISCELLANEOUS) ×1
ATTRACTOMAT 16X20 MAGNETIC DRP (DRAPES) ×2 IMPLANT
BAG COUNTER SPONGE SURGICOUNT (BAG) ×2 IMPLANT
BAG SPNG CNTER NS LX DISP (BAG) ×1
BLADE SURG 15 STRL LF DISP TIS (BLADE) ×2 IMPLANT
BLADE SURG 15 STRL SS (BLADE) ×1
CHLORAPREP W/TINT 26 (MISCELLANEOUS) ×2 IMPLANT
CLIP TI MEDIUM 6 (CLIP) ×4 IMPLANT
CLIP TI WIDE RED SMALL 6 (CLIP) ×4 IMPLANT
COVER SURGICAL LIGHT HANDLE (MISCELLANEOUS) ×2 IMPLANT
DERMABOND ADVANCED .7 DNX12 (GAUZE/BANDAGES/DRESSINGS) ×2 IMPLANT
DRAPE LAPAROTOMY T 98X78 PEDS (DRAPES) ×2 IMPLANT
DRAPE UTILITY XL STRL (DRAPES) ×2 IMPLANT
ELECT REM PT RETURN 15FT ADLT (MISCELLANEOUS) ×2 IMPLANT
GAUZE 4X4 16PLY ~~LOC~~+RFID DBL (SPONGE) ×2 IMPLANT
GLOVE SURG ORTHO 8.0 STRL STRW (GLOVE) ×2 IMPLANT
GOWN STRL REUS W/ TWL XL LVL3 (GOWN DISPOSABLE) ×6 IMPLANT
GOWN STRL REUS W/TWL XL LVL3 (GOWN DISPOSABLE) ×3
HEMOSTAT SURGICEL 2X4 FIBR (HEMOSTASIS) ×2 IMPLANT
ILLUMINATOR WAVEGUIDE N/F (MISCELLANEOUS) IMPLANT
KIT BASIN OR (CUSTOM PROCEDURE TRAY) ×2 IMPLANT
KIT TURNOVER KIT A (KITS) IMPLANT
NDL HYPO 25X1 1.5 SAFETY (NEEDLE) ×2 IMPLANT
NEEDLE HYPO 25X1 1.5 SAFETY (NEEDLE) ×1 IMPLANT
PACK BASIC VI WITH GOWN DISP (CUSTOM PROCEDURE TRAY) ×2 IMPLANT
PENCIL SMOKE EVACUATOR (MISCELLANEOUS) ×2 IMPLANT
SHEARS HARMONIC 9CM CVD (BLADE) IMPLANT
SUT MNCRL AB 4-0 PS2 18 (SUTURE) ×2 IMPLANT
SUT VIC AB 3-0 SH 18 (SUTURE) ×2 IMPLANT
SYR BULB IRRIG 60ML STRL (SYRINGE) ×2 IMPLANT
SYR CONTROL 10ML LL (SYRINGE) ×2 IMPLANT
TOWEL OR 17X26 10 PK STRL BLUE (TOWEL DISPOSABLE) ×2 IMPLANT
TOWEL OR NON WOVEN STRL DISP B (DISPOSABLE) ×2 IMPLANT
TUBING CONNECTING 10 (TUBING) ×2 IMPLANT

## 2022-01-10 NOTE — Op Note (Signed)
Operative Note  Pre-operative Diagnosis:  primary hyperparathyroidism  Post-operative Diagnosis:  same; bilateral parathyroid adenomas  Surgeon:  Armandina Gemma, MD  Assistant:  none   Procedure:  Neck exploration with resection of bilateral inferior parathyroid adenomas  Anesthesia:  general per Dr. Annye Asa  Estimated Blood Loss:  25 cc  Drains: none         Specimen: bilateral inferior parathyroid glands to pathology with frozen sections  Indications:  Patient is referred by Dr. Jacelyn Pi for surgical evaluation and management of primary hyperparathyroidism. Patient had been noted on routine laboratory studies by her gynecologist to have an elevated serum calcium level. This is ranged from 10.8 to as high as 12.0. Recent intact PTH level was elevated at 158. Vitamin D level was normal at 54.6. Patient underwent nuclear medicine parathyroid scan on November 19, 2021. This localized both a right and left inferior parathyroid adenoma. Patient is now referred to consider surgery. Patient has no prior history of head or neck surgery. She does have a history of a thyroid nodule for which she underwent fine-needle aspiration biopsy approximately 5 years ago with benign cytopathology. Patient has noted significant fatigue. She describes bone and joint pain. She has had problems with nephrolithiasis requiring lithotripsy. She underwent a bone density scan which documented osteoporosis.  Patient underwent a preoperative ultrasound examination of the neck which demonstrated a 5 cm cystic mass in the left inferior position.  Patient now comes to surgery for neck exploration and parathyroidectomy.  Procedure:  The patient was seen in the pre-op holding area. The risks, benefits, complications, treatment options, and expected outcomes were previously discussed with the patient. The patient agreed with the proposed plan and has signed the informed consent form.  The patient was brought to the  operating room by the surgical team, identified as Barbara Jimenez and the procedure verified. A "time out" was completed and the above information confirmed.  Following induction of general anesthesia, the patient is positioned and then prepped and draped in usual aseptic fashion.  After ascertaining that an adequate level of anesthesia been achieved, a small central Kocher incision is made with a #15 blade.  Dissection was carried into the subcutaneous tissues.  Platysma is divided with the electrocautery.  Skin flaps are elevated cephalad and caudad.  Self-retaining retractors are placed for exposure.  Strap muscles are incised in the midline.  Thyroid gland is exposed.  Strap muscles are reflected to the left exposing the left thyroid lobe.  Left lobe was gently mobilized.  Venous tributaries are divided between ligaclips with the harmonic scalpel.  A large cystic mass measuring approximately 5 cm in size is identified posterior to the inferior left thyroid lobe and extending along the esophagus into the posterior mediastinum.  This is gently mobilized using the harmonic scalpel.  Vascular structures are divided between small and medium ligaclips.  Along the medial aspect of the cystic mass the recurrent laryngeal nerve was identified and preserved.  It was meticulously dissected off of the medial wall of the mass and preserved along its course.  The mass was also somewhat adherent to the esophagus laterally.  Again meticulous dissection allowed for separation of the structures and use of the harmonic scalpel for dividing tissue and obtaining hemostasis.  The mass was completely excised.  It appeared to be cystic in the cephalad portion of the mass but there appeared to be more solid parenchyma at the inferior aspect of the mass.  It was submitted for  frozen section biopsy which confirmed hypercellular parathyroid tissue.  Further dissection on the left side superiorly reveals a normal superior parathyroid  gland posterior to the superior pole of the thyroid and above the level of the inferior thyroid artery.  This is preserved.  A dry pack is placed in the left neck.  Next we turned our attention to the right side of the neck.  Strap muscles are again reflected laterally exposing a normal right thyroid lobe.  Right lobe was gently mobilized and venous tributaries divided between small and medium ligaclips.  Dissection reveals a normal right superior parathyroid gland at the level of the inferior thyroid artery.  Further dissection posterior to the inferior pole of the right lobe reveals an enlarged inferior parathyroid gland consistent with adenoma.  The recurrent laryngeal nerve traverses over the top of the gland and medial to the gland.  With gentle dissection the adenoma is separated from the recurrent nerve and dissected out down to its vascular pedicle.  Vascular structures are divided between medium ligaclips with the harmonic scalpel.  The gland is excised and frozen section confirms hypercellular parathyroid tissue consistent with adenoma.  The neck is irrigated with warm saline.  Good hemostasis is achieved throughout the operative field.  Fibrillar is placed throughout the operative field.  Strap muscles are reapproximated in the midline of interrupted 3-0 Vicryl sutures.  Platysma is closed with interrupted 3-0 Vicryl sutures.  Skin is anesthetized with local anesthetic.  Skin edges are reapproximated with a running 4-0 Monocryl subcuticular suture.  Wound is washed and dried and Dermabond is applied as dressing.  Patient is awakened from anesthesia and transported to the recovery room in stable condition.  The patient tolerated the procedure well.   Armandina Gemma, [place] [facility] Office: [phone]

## 2022-01-10 NOTE — Interval H&P Note (Signed)
History and Physical Interval Note:  01/10/2022 11:24 AM  Barbara Jimenez  has presented today for surgery, with the diagnosis of PRIMARY HYPERPARATHYROIDISM.  The various methods of treatment have been discussed with the patient and family. After consideration of risks, benefits and other options for treatment, the patient has consented to    Procedure(s): NECK EXPLORATION WITH PARATHYROIDECTOMY (TWO GLANDS) (N/A) as a surgical intervention.    The patient's history has been reviewed, patient examined, no change in status, stable for surgery.  I have reviewed the patient's chart and labs.  Questions were answered to the patient's satisfaction.    Armandina Gemma, [facility] [place] practice Office: [place]

## 2022-01-10 NOTE — Transfer of Care (Signed)
Immediate Anesthesia Transfer of Care Note  Patient: Barbara Jimenez  Procedure(s) Performed: NECK EXPLORATION WITH PARATHYROIDECTOMY (TWO GLANDS) (Neck)  Patient Location: PACU  Anesthesia Type:General  Level of Consciousness: oriented, drowsy, and patient cooperative  Airway & Oxygen Therapy: Patient Spontanous Breathing and Patient connected to nasal cannula oxygen  Post-op Assessment: Report given to RN and Post -op Vital signs reviewed and stable  Post vital signs: Reviewed  Last Vitals:  Vitals Value Taken Time  BP 143/63 01/10/22 1449  Temp 37.1 C 01/10/22 1449  Pulse 85 01/10/22 1452  Resp 16 01/10/22 1452  SpO2 95 % 01/10/22 1452  Vitals shown include unvalidated device data.  Last Pain:  Vitals:   01/10/22 1449  TempSrc:   PainSc: 0-No pain         Complications: No notable events documented.

## 2022-01-10 NOTE — Anesthesia Preprocedure Evaluation (Addendum)
Anesthesia Evaluation  Patient identified by MRN, date of birth, ID band Patient awake    Reviewed: Allergy & Precautions, NPO status , Patient's Chart, lab work & pertinent test results  History of Anesthesia Complications (+) PONV  Airway Mallampati: II  TM Distance: >3 FB Neck ROM: Full    Dental  (+) Teeth Intact, Dental Advisory Given   Pulmonary former smoker   breath sounds clear to auscultation       Cardiovascular  Rhythm:Regular Rate:Normal  02/2021 ECHO: NORMAL LV SYSTOLIC FUNCTION    NORMAL LA PRESSURES WITH NORMAL DIASTOLIC FUNCTION    NORMAL RV SYSTOLIC FUNCTION    TRIVIAL MR, TRIVIAL TR, NO VALVULAR STENOSIS     Neuro/Psych  Headaches    GI/Hepatic Neg liver ROS,GERD  Medicated,,  Endo/Other  negative endocrine ROS    Renal/GU H/o stones     Musculoskeletal   Abdominal   Peds  Hematology negative hematology ROS (+)   Anesthesia Other Findings   Reproductive/Obstetrics                              Anesthesia Physical Anesthesia Plan  ASA: 2  Anesthesia Plan: General   Post-op Pain Management: Tylenol PO (pre-op)*   Induction: Intravenous  PONV Risk Score and Plan: 4 or greater and Ondansetron, Dexamethasone, Midazolam, Scopolamine patch - Pre-op and TIVA  Airway Management Planned: Oral ETT  Additional Equipment: None  Intra-op Plan:   Post-operative Plan: Extubation in OR  Informed Consent: I have reviewed the patients History and Physical, chart, labs and discussed the procedure including the risks, benefits and alternatives for the proposed anesthesia with the patient or authorized representative who has indicated his/her understanding and acceptance.     Dental advisory given  Plan Discussed with: CRNA and Surgeon  Anesthesia Plan Comments:          Anesthesia Quick Evaluation

## 2022-01-10 NOTE — Anesthesia Procedure Notes (Signed)
Procedure Name: Intubation Date/Time: 01/10/2022 12:23 PM  Performed by: Jenne Campus, CRNAPre-anesthesia Checklist: Patient identified, Emergency Drugs available, Suction available and Patient being monitored Patient Re-evaluated:Patient Re-evaluated prior to induction Oxygen Delivery Method: Circle System Utilized Preoxygenation: Pre-oxygenation with 100% oxygen Induction Type: IV induction Ventilation: Mask ventilation without difficulty Laryngoscope Size: Miller and 3 Grade View: Grade I Tube type: Oral Tube size: 7.0 mm Number of attempts: 1 Airway Equipment and Method: Stylet and Oral airway Placement Confirmation: ETT inserted through vocal cords under direct vision, positive ETCO2 and breath sounds checked- equal and bilateral Secured at: 22 cm Tube secured with: Tape Dental Injury: Teeth and Oropharynx as per pre-operative assessment

## 2022-01-11 ENCOUNTER — Encounter (HOSPITAL_COMMUNITY): Payer: Self-pay | Admitting: Surgery

## 2022-01-11 DIAGNOSIS — E210 Primary hyperparathyroidism: Secondary | ICD-10-CM | POA: Diagnosis not present

## 2022-01-11 LAB — BASIC METABOLIC PANEL
Anion gap: 6 (ref 5–15)
BUN: 18 mg/dL (ref 6–20)
CO2: 25 mmol/L (ref 22–32)
Calcium: 9.4 mg/dL (ref 8.9–10.3)
Chloride: 107 mmol/L (ref 98–111)
Creatinine, Ser: 0.92 mg/dL (ref 0.44–1.00)
GFR, Estimated: >60 mL/min (ref >60)
Glucose, Bld: 135 mg/dL — ABNORMAL HIGH (ref 70–99)
Potassium: 4.5 mmol/L (ref 3.5–5.1)
Sodium: 138 mmol/L (ref 135–145)

## 2022-01-11 LAB — SURGICAL PATHOLOGY

## 2022-01-11 MED ORDER — OXYCODONE HCL 5 MG PO TABS: 5.0000 mg | ORAL_TABLET | Freq: Four times a day (QID) | ORAL | 0 refills | Status: AC | PRN

## 2022-01-11 NOTE — Progress Notes (Signed)
Placed page to Dr. Donne Hazel- on call doctor. Informed him of pt's vital signs (BP and pulse) from around 2300 and the most recent of 119/64 and heart rate of 54. He was informed of her getting Dilaudid '1mg'$  every 2 hours prn, earlier and that this may have been the cause of the heart rate being low, at the time. He stated that he wasn't concerned, if she was asymptomatic. No orders received.

## 2022-01-11 NOTE — Discharge Summary (Signed)
Physician Discharge Summary   Patient ID: Barbara Jimenez MRN: [id number] DOB/AGE: 57-Nov-1967 57 y.o.  Admit date: 01/10/2022  Discharge date: 01/11/2022  Discharge Diagnoses:  Principal Problem:   Hyperparathyroidism, primary [facility]) Active Problems:   Primary hyperparathyroidism [facility])   Discharged Condition: good  Hospital Course: Patient was admitted for observation following parathyroid surgery.  Post op course was uncomplicated.  Pain was well controlled.  Tolerated diet.  Post op calcium level on morning following surgery was 9.4 mg/dl.  Patient was prepared for discharge home on POD#1.  Consults: None  Treatments: surgery: neck exploration and bilateral parathyroidectomy  Discharge Exam: Blood pressure (!) 106/50, pulse (!) 54, temperature 97.9 F (36.6 C), temperature source Oral, resp. rate 16, height '5\' 7"'$  (1.702 m), weight 73 kg, SpO2 100 %. HEENT - clear Neck - wound dry and intact; mild STS; voice normal; Dermabond in place  Disposition: Home  Discharge Instructions     Diet - low sodium heart healthy   Complete by: As directed    Increase activity slowly   Complete by: As directed    No dressing needed   Complete by: As directed       Allergies as of 01/11/2022       Reactions   Codeine Itching   Other Nausea And Vomiting, Other (See Comments)   Narcotics do not agree with the patient- can tolerate Dilaudid, though        Medication List     TAKE these medications    bimatoprost 0.03 % ophthalmic solution Commonly known as: LATISSE Place 1 drop into both eyes every other day.   cyclobenzaprine 10 MG tablet Commonly known as: FLEXERIL Take 10 mg by mouth at bedtime as needed (for sleep).   docusate sodium 100 MG capsule Commonly known as: Colace Take 1 capsule (100 mg total) by mouth daily as needed for up to 30 doses.   dutasteride 0.5 MG capsule Commonly known as: AVODART Take 0.5 mg by mouth daily.   estradiol 2 MG  tablet Commonly known as: ESTRACE Take 2 mg by mouth in the morning.   ibuprofen 200 MG tablet Commonly known as: ADVIL Take 800 mg by mouth every 6 (six) hours as needed for mild pain or headache.   MAGNESIUM PO Take 2 tablets by mouth in the morning and at bedtime.   omeprazole 40 MG capsule Commonly known as: PRILOSEC Take 1 capsule (40 mg total) by mouth daily. What changed:  when to take this reasons to take this   ondansetron 4 MG disintegrating tablet Commonly known as: ZOFRAN-ODT Take 1 tablet (4 mg total) by mouth every 8 (eight) hours as needed for nausea or vomiting.   oxyCODONE 5 MG immediate release tablet Commonly known as: Oxy IR/ROXICODONE Take 1-2 tablets (5-10 mg total) by mouth every 6 (six) hours as needed for moderate pain.   oxyCODONE-acetaminophen 5-325 MG tablet Commonly known as: PERCOCET/ROXICET Take 1-2 tablets by mouth every 8 (eight) hours as needed for severe pain.   progesterone 100 MG capsule Commonly known as: PROMETRIUM Take 100 mg by mouth at bedtime.   rosuvastatin 10 MG tablet Commonly known as: CRESTOR Take 10 mg by mouth at bedtime.   spironolactone 100 MG tablet Commonly known as: ALDACTONE Take 100 mg by mouth daily.   sucralfate 1 GM/10ML suspension Commonly known as: Carafate Take 10 mLs (1 g total) by mouth 4 (four) times daily -  with meals and at bedtime.  Discharge Care Instructions  (From admission, onward)           Start     Ordered   01/11/22 0000  No dressing needed        01/11/22 0818            Follow-up Information     Armandina Gemma, MD. Schedule an appointment as soon as possible for a visit in 3 week(s).   Specialty: General Surgery Why: For wound re-check Contact information: [place] [address] [phone]                 [place], [facility] Office: [phone]   Signed: Armandina Gemma 01/11/2022,  8:18 AM

## 2022-01-11 NOTE — Anesthesia Postprocedure Evaluation (Signed)
Anesthesia Post Note  Patient: Barbara Jimenez  Procedure(s) Performed: NECK EXPLORATION WITH PARATHYROIDECTOMY (TWO GLANDS) (Neck)     Patient location during evaluation: Nursing Unit Anesthesia Type: General Level of consciousness: awake and alert, patient cooperative and oriented Pain management: pain level controlled Vital Signs Assessment: post-procedure vital signs reviewed and stable Respiratory status: spontaneous breathing, nonlabored ventilation and respiratory function stable Cardiovascular status: blood pressure returned to baseline and stable Postop Assessment: able to ambulate, adequate PO intake and no apparent nausea or vomiting Anesthetic complications: no   No notable events documented.  Last Vitals:  Vitals:   01/11/22 0400 01/11/22 0717  BP:  (!) 106/50  Pulse: 62 (!) 54  Resp:  16  Temp:  36.6 C  SpO2: 99% 100%    Last Pain:  Vitals:   01/11/22 0806  TempSrc:   PainSc: 3                  Aaisha Sliter,E. Dearis Danis

## 2022-01-11 NOTE — Discharge Instructions (Signed)
[facility] - Dr. Jaelynn Currier  THYROID & PARATHYROID SURGERY:  POST-OP INSTRUCTIONS  Always review the instruction sheet provided by the hospital nurse at discharge.  A prescription for pain medication may be sent to your pharmacy at the time of discharge.  Take your pain medication as prescribed.  If narcotic pain medicine is not needed, then you may take acetaminophen (Tylenol) or ibuprofen (Advil) as needed for pain or soreness.  Take your normal home medications as prescribed unless otherwise directed.  If you need a refill on your pain medication, please contact the office during regular business hours.  Prescriptions will not be processed by the office after 5:00PM or on weekends.  Start with a light diet upon arrival home, such as soup and crackers or toast.  Be sure to drink plenty of fluids.  Resume your normal diet the day after surgery.  Most patients will experience some swelling and bruising on the chest and neck area.  Ice packs will help for the first 48 hours after arriving home.  Swelling and bruising will take several days to resolve.   It is common to experience some constipation after surgery.  Increasing fluid intake and taking a stool softener (Colace) will usually help to prevent this problem.  A mild laxative (Milk of Magnesia or Miralax) should be taken according to package directions if there has been no bowel movement after 48 hours.  Dermabond glue covers your incision. This seals the wound and you may shower at any time. The Dermabond will remain in place for about a week.  You may gradually remove the glue when it loosens around the edges.  If you need to loosen the Dermabond for removal, apply a layer of Vaseline to the wound for 15 minutes and then remove with a Kleenex. Your sutures are under the skin and will not show - they will dissolve on their own.  You may resume light daily activities beginning the day after discharge (such as self-care,  walking, climbing stairs), gradually increasing activities as tolerated. You may have sexual intercourse when it is comfortable. Refrain from any heavy lifting or straining until approved by your doctor. You may drive when you no longer are taking prescription pain medication, you can comfortably wear a seatbelt, and you can safely maneuver your car and apply the brakes.  You will see your doctor in the office for a follow-up appointment approximately three weeks after your surgery.  Make sure that you call for this appointment within a day or two after you arrive home to insure a convenient appointment time. Please have any requested laboratory tests performed a few days prior to your office visit so that the results will be available at your follow up appointment.  WHEN TO CALL THE [facility] OFFICE: -- Fever greater than 101.5 -- Inability to urinate -- Nausea and/or vomiting - persistent -- Extreme swelling or bruising -- Continued bleeding from incision -- Increased pain, redness, or drainage from the incision -- Difficulty swallowing or breathing -- Muscle cramping or spasms -- Numbness or tingling in hands or around lips  The clinic staff is available to answer your questions during regular business hours.  Please don't hesitate to call and ask to speak to one of the nurses if you have concerns.  [facility] OFFICE: [phone] (24 hours)  Please sign up for MyChart accounts. This will allow you to communicate directly with my nurse or myself without having to call the office. It will also allow you   to view your test results. You will need to enroll in MyChart for my office ([facility]) and for the hospital ([facility]).  Elizette Shek, MD [facility] practice 

## 2022-01-11 NOTE — TOC Progression Note (Signed)
Transition of Care [facility]) Screening Note  Patient Details  Name: Barbara Jimenez Date of Birth: Oct 02, 1965  [facility]) CM/SW Contact:    Sherie Don, LCSW Phone Number: 01/11/2022, 9:45 AM  Transition of Care Department [facility]) has reviewed patient and no TOC needs have been identified at this time. We will continue to monitor patient advancement through interdisciplinary progression rounds. If new patient transition needs arise, please place a TOC consult.

## 2022-01-12 ENCOUNTER — Other Ambulatory Visit: Payer: Self-pay | Admitting: Surgery

## 2022-01-12 DIAGNOSIS — E210 Primary hyperparathyroidism: Secondary | ICD-10-CM

## 2022-01-12 LAB — RENAL FUNCTION PANEL
Albumin: 3.8 g/dL (ref 3.5–5.0)
Anion gap: 7 (ref 5–15)
BUN: 21 mg/dL — ABNORMAL HIGH (ref 6–20)
CO2: 27 mmol/L (ref 22–32)
Calcium: 8.5 mg/dL — ABNORMAL LOW (ref 8.9–10.3)
Chloride: 108 mmol/L (ref 98–111)
Creatinine, Ser: 1.11 mg/dL — ABNORMAL HIGH (ref 0.44–1.00)
GFR, Estimated: 58 mL/min — ABNORMAL LOW (ref >60)
Glucose, Bld: 89 mg/dL (ref 70–99)
Phosphorus: 4.1 mg/dL (ref 2.5–4.6)
Potassium: 3.4 mmol/L — ABNORMAL LOW (ref 3.5–5.1)
Sodium: 142 mmol/L (ref 135–145)

## 2022-01-12 NOTE — Progress Notes (Signed)
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  The large cyst on the left was part of the left parathyroid gland.  Both are completely removed.  Joy, MD [facility] [place] practice Office: [phone]

## 2022-01-12 NOTE — Progress Notes (Signed)
Calcium is low normal at 8.5.  Still a big drop from pre-op levels and may explain paresthesias.  Will start taking TUMS 2 tablets three or four times daily.  Barbara Jimenez - let's check a calcium level on her Tuesday or Wednesday at [place], [place] [facility] [place] practice Office: [phone]
